# Patient Record
Sex: Male | Born: 1945
Health system: Southern US, Community
[De-identification: ages and names within clinical notes are randomized; demographics above are authoritative.]

## PROBLEM LIST (undated history)

## (undated) DIAGNOSIS — I10 Essential (primary) hypertension: Secondary | ICD-10-CM

## (undated) DIAGNOSIS — J45909 Unspecified asthma, uncomplicated: Secondary | ICD-10-CM

## (undated) DIAGNOSIS — I350 Nonrheumatic aortic (valve) stenosis: Secondary | ICD-10-CM

## (undated) DIAGNOSIS — K219 Gastro-esophageal reflux disease without esophagitis: Secondary | ICD-10-CM

## (undated) DIAGNOSIS — R079 Chest pain, unspecified: Secondary | ICD-10-CM

## (undated) DIAGNOSIS — M1990 Unspecified osteoarthritis, unspecified site: Secondary | ICD-10-CM

## (undated) DIAGNOSIS — E785 Hyperlipidemia, unspecified: Secondary | ICD-10-CM

## (undated) DIAGNOSIS — J449 Chronic obstructive pulmonary disease, unspecified: Secondary | ICD-10-CM

## (undated) HISTORY — DX: Essential (primary) hypertension: I10

## (undated) HISTORY — DX: Nonrheumatic aortic (valve) stenosis: I35.0

## (undated) HISTORY — DX: Chest pain, unspecified: R07.9

## (undated) HISTORY — DX: Chronic obstructive pulmonary disease, unspecified: J44.9

## (undated) HISTORY — DX: Hyperlipidemia, unspecified: E78.5

---

## 2002-08-08 ENCOUNTER — Emergency Department (HOSPITAL_COMMUNITY): Admission: EM | Admit: 2002-08-08 | Discharge: 2002-08-08 | Payer: Self-pay | Admitting: *Deleted

## 2005-05-20 ENCOUNTER — Ambulatory Visit (HOSPITAL_COMMUNITY): Admission: RE | Admit: 2005-05-20 | Discharge: 2005-05-20 | Payer: Self-pay | Admitting: Family Medicine

## 2007-11-11 ENCOUNTER — Encounter: Admission: RE | Admit: 2007-11-11 | Discharge: 2007-11-11 | Payer: Self-pay | Admitting: Neurosurgery

## 2008-03-03 HISTORY — PX: CATARACT EXTRACTION: SUR2

## 2012-07-13 ENCOUNTER — Encounter (HOSPITAL_COMMUNITY): Payer: Self-pay | Admitting: Pharmacy Technician

## 2012-07-13 NOTE — H&P (Signed)
NTS SOAP Note  Vital Signs:  Vitals as of: 07/13/2012: Systolic 153: Diastolic 80: Heart Rate 67: Temp 98.23F: Height 79ft 8in: Weight 195Lbs 0 Ounces: BMI 29.65  BMI : 29.65 kg/m2  Subjective: This 14 Years 45 Months old Male presents for screening TCS.  Never has had a colonosocpy.  No GI complaints.  No family h/o colon cancer.   Review of Symptoms:  Constitutional:unremarkable   Head:unremarkable    Eyes:unremarkable   Nose/Mouth/Throat:unremarkable Cardiovascular:  unremarkable   Respiratory:unremarkable   Gastrointestinal:  unremarkable   Genitourinary:unremarkable       joint, neck pain Skin:unremarkable Hematolgic/Lymphatic:unremarkable       hay fever   Past Medical History:    Reviewed   Past Medical History  Surgical History: unremarkable Medical Problems:  High Blood pressure Allergies: hydrocodone, amoxicillin Medications: crestor, omeprazole, fish oil   Social History:Reviewed  Social History  Preferred Language: English Race:  White Ethnicity: Not Hispanic / Latino Age: 67 Years 0 Months Marital Status:  M Alcohol: 2-4 beers a week Recreational drug(s):  No   Smoking Status: Unknown if ever smoked reviewed on 07/13/2012 Functional Status reviewed on mm/dd/yyyy ------------------------------------------------ Bathing: Normal Cooking: Normal Dressing: Normal Driving: Normal Eating: Normal Managing Meds: Normal Oral Care: Normal Shopping: Normal Toileting: Normal Transferring: Normal Walking: Normal Cognitive Status reviewed on mm/dd/yyyy ------------------------------------------------ Attention: Normal Decision Making: Normal Language: Normal Memory: Normal Motor: Normal Perception: Normal Problem Solving: Normal Visual and Spatial: Normal   Family History:  Reviewed   Family History              Father:  Cancer-lung, Coronary Artery Disease             Mother:  Coronary Artery  Disease    Objective Information: General:  Well appearing, well nourished in no distress. Heart:  RRR, no murmur Lungs:    CTA bilaterally, no wheezes, rhonchi, rales.  Breathing unlabored. Abdomen:Soft, NT/ND, no HSM, no masses.   deferred to procedure  Assessment:Need for screening TCS  Diagnosis &amp; Procedure Smart Code   Plan:Scheduled for screening TCS on 07/20/12.   Patient Education:Alternative treatments to surgery were discussed with patient (and family).  Risks and benefits  of procedure were fully explained to the patient (and family) who gave informed consent. Patient/family questions were addressed.  Follow-up:Pending Surgery

## 2012-07-20 ENCOUNTER — Encounter (HOSPITAL_COMMUNITY)
Admission: RE | Disposition: A | Discharge status: Home / Self Care | Payer: Self-pay | Source: Ambulatory Visit | Attending: General Surgery

## 2012-07-20 ENCOUNTER — Ambulatory Visit (HOSPITAL_COMMUNITY)
Admission: RE | Admit: 2012-07-20 | Discharge: 2012-07-20 | Disposition: A | Discharge status: Home / Self Care | Payer: Medicare Other | Source: Ambulatory Visit | Attending: General Surgery | Admitting: General Surgery

## 2012-07-20 ENCOUNTER — Encounter (HOSPITAL_COMMUNITY): Payer: Self-pay | Admitting: *Deleted

## 2012-07-20 DIAGNOSIS — K5730 Diverticulosis of large intestine without perforation or abscess without bleeding: Secondary | ICD-10-CM | POA: Insufficient documentation

## 2012-07-20 DIAGNOSIS — D129 Benign neoplasm of anus and anal canal: Secondary | ICD-10-CM | POA: Insufficient documentation

## 2012-07-20 DIAGNOSIS — Z1211 Encounter for screening for malignant neoplasm of colon: Secondary | ICD-10-CM | POA: Insufficient documentation

## 2012-07-20 DIAGNOSIS — D128 Benign neoplasm of rectum: Secondary | ICD-10-CM | POA: Insufficient documentation

## 2012-07-20 DIAGNOSIS — I10 Essential (primary) hypertension: Secondary | ICD-10-CM | POA: Insufficient documentation

## 2012-07-20 HISTORY — DX: Essential (primary) hypertension: I10

## 2012-07-20 HISTORY — DX: Gastro-esophageal reflux disease without esophagitis: K21.9

## 2012-07-20 HISTORY — PX: COLONOSCOPY: SHX5424

## 2012-07-20 HISTORY — DX: Unspecified osteoarthritis, unspecified site: M19.90

## 2012-07-20 SURGERY — COLONOSCOPY
Anesthesia: Moderate Sedation

## 2012-07-20 MED ORDER — MIDAZOLAM HCL 5 MG/5ML IJ SOLN
INTRAMUSCULAR | Status: AC
  Filled 2012-07-20: qty 5

## 2012-07-20 MED ORDER — MEPERIDINE HCL 25 MG/ML IJ SOLN
INTRAMUSCULAR | Status: DC | PRN
  Administered 2012-07-20: 50 mg via INTRAVENOUS

## 2012-07-20 MED ORDER — SODIUM CHLORIDE 0.9 % IV SOLN
INTRAVENOUS | Status: DC
  Administered 2012-07-20: 08:00:00 via INTRAVENOUS

## 2012-07-20 MED ORDER — STERILE WATER FOR IRRIGATION IR SOLN
Status: DC | PRN
  Administered 2012-07-20: 09:00:00

## 2012-07-20 MED ORDER — MIDAZOLAM HCL 5 MG/5ML IJ SOLN
INTRAMUSCULAR | Status: DC | PRN
  Administered 2012-07-20: 4 mg via INTRAVENOUS

## 2012-07-20 MED ORDER — MEPERIDINE HCL 50 MG/ML IJ SOLN
INTRAMUSCULAR | Status: AC
  Filled 2012-07-20: qty 1

## 2012-07-20 NOTE — Interval H&P Note (Signed)
History and Physical Interval Note:  07/20/2012 8:31 AM  Colin Weber  has presented today for surgery, with the diagnosis of screening  The various methods of treatment have been discussed with the patient and family. After consideration of risks, benefits and other options for treatment, the patient has consented to  Procedure(s): COLONOSCOPY (N/A) as a surgical intervention .  The patient's history has been reviewed, patient examined, no change in status, stable for surgery.  I have reviewed the patient's chart and labs.  Questions were answered to the patient's satisfaction.     Franky Macho A

## 2012-07-20 NOTE — Op Note (Signed)
[facility] [address] [place], [address]   COLONOSCOPY PROCEDURE REPORT  PATIENT: Colin Weber, Colin Weber  MR#: [id number] BIRTHDATE: 1945-09-29 , 67  yrs. old GENDER: Male ENDOSCOPIST: Franky Macho, MD REFERRED XB:JYNWG, Tammy PROCEDURE DATE:  07/20/2012 PROCEDURE:   Colonoscopy with snare polypectomy ASA CLASS:   Class II INDICATIONS:Average risk patient for colon cancer. MEDICATIONS: Demerol 50 mg IV and Versed 4 mg IV  DESCRIPTION OF PROCEDURE:   After the risks benefits and alternatives of the procedure were thoroughly explained, informed consent was obtained.  A digital rectal exam revealed no abnormalities of the rectum.   The [id number] ([id number])  endoscope was introduced through the anus and advanced to the cecum, which was identified by both the appendix and ileocecal valve. No adverse events experienced.   The quality of the prep was adequate, using MoviPrep  The instrument was then slowly withdrawn as the colon was fully examined.      COLON FINDINGS: Moderate diverticulosis was noted in the transverse colon and descending colon.   A smooth sessile polyp ranging between 3-60mm in size was found in the rectum.  A polypectomy was performed using snare cautery.  The resection was complete and the polyp tissue was completely retrieved.  Retroflexed views revealed no abnormalities. The time to cecum=4 minutes 0 seconds. Withdrawal time=6 minutes 0 seconds.  The scope was withdrawn and the procedure completed. COMPLICATIONS: There were no complications.  ENDOSCOPIC IMPRESSION: 1.   Moderate diverticulosis was noted in the transverse colon and descending colon 2.   Sessile polyp ranging between 3-9mm in size was found in the rectum; polypectomy was performed using snare cautery  RECOMMENDATIONS: 1.  Await pathology results 2.  Repeat Colonoscopy in 3 years.   eSigned:  Franky Macho, MD 07/20/2012 8:52 AM   cc:

## 2012-07-22 ENCOUNTER — Encounter (HOSPITAL_COMMUNITY): Payer: Self-pay | Admitting: General Surgery

## 2015-05-21 DIAGNOSIS — I10 Essential (primary) hypertension: Secondary | ICD-10-CM | POA: Diagnosis not present

## 2015-05-21 DIAGNOSIS — Z125 Encounter for screening for malignant neoplasm of prostate: Secondary | ICD-10-CM | POA: Diagnosis not present

## 2015-05-21 DIAGNOSIS — E782 Mixed hyperlipidemia: Secondary | ICD-10-CM | POA: Diagnosis not present

## 2015-05-23 DIAGNOSIS — I10 Essential (primary) hypertension: Secondary | ICD-10-CM | POA: Diagnosis not present

## 2015-05-23 DIAGNOSIS — J00 Acute nasopharyngitis [common cold]: Secondary | ICD-10-CM | POA: Diagnosis not present

## 2015-05-23 DIAGNOSIS — E782 Mixed hyperlipidemia: Secondary | ICD-10-CM | POA: Diagnosis not present

## 2015-05-23 DIAGNOSIS — J449 Chronic obstructive pulmonary disease, unspecified: Secondary | ICD-10-CM | POA: Diagnosis not present

## 2015-11-28 DIAGNOSIS — E782 Mixed hyperlipidemia: Secondary | ICD-10-CM | POA: Diagnosis not present

## 2015-11-30 DIAGNOSIS — J449 Chronic obstructive pulmonary disease, unspecified: Secondary | ICD-10-CM | POA: Diagnosis not present

## 2015-11-30 DIAGNOSIS — E785 Hyperlipidemia, unspecified: Secondary | ICD-10-CM | POA: Diagnosis not present

## 2015-11-30 DIAGNOSIS — I10 Essential (primary) hypertension: Secondary | ICD-10-CM | POA: Diagnosis not present

## 2015-11-30 DIAGNOSIS — Z0000 Encounter for general adult medical examination without abnormal findings: Secondary | ICD-10-CM | POA: Diagnosis not present

## 2015-12-27 DIAGNOSIS — Z86010 Personal history of colonic polyps: Secondary | ICD-10-CM | POA: Diagnosis not present

## 2015-12-27 NOTE — H&P (Signed)
  NTS SOAP Note  Vital Signs:  Vitals as of: [id number]: Systolic [id number]: Diastolic 81: Heart Rate 72: Temp 57F (Temporal): Height 20ft 6in: Weight 186Lbs 0 Ounces: BMI 30.02   BMI : 30.02 kg/m2  Subjective: This 70 year old male presents for of personal h/o colon polyps.  Had a polyp removed in 5/14.  Here for follow up scheduling of colonoscopy.  Referred by Dr. Nevada Crane.  Denies any gi complaints, blood in stool, weight changes, abdominal pain, diarrhea, constipation, family h/o colon cancer.  Review of Symptoms:  Constitutional:negative Head:negative Eyes:negative Nose/Mouth/Throat:negative Cardiovascular:negative Respiratory:dyspnea, wheezing Gastrointestinnegative Genitourinary:negative Musculoskeletal:negative Skin:negative Hematolgic/Lymphatic:negative Allergic/Immunologic:negative   Past Medical History:Reviewed  Past Medical History  Surgical History: TCS 5/14 Medical Problems: HTN, copd Allergies: nkda Medications: atenolol, fish oil, combivent   Social History:Reviewed  Social History  Preferred Language: English Race:  White Ethnicity: Not Hispanic / Latino Age: 23 year Marital Status:  S Alcohol: unknown   Smoking Status: Current every day smoker reviewed on 12/27/2015 Started Date:  Packs per week:  Functional Status reviewed on 12/27/2015 ------------------------------------------------ Bathing: Normal Cooking: Normal Dressing: Normal Driving: Normal Eating: Normal Managing Meds: Normal Oral Care: Normal Shopping: Normal Toileting: Normal Transferring: Normal Walking: Normal Cognitive Status reviewed on 12/27/2015 ------------------------------------------------ Attention: Normal Decision Making: Normal Language: Normal Memory: Normal Motor: Normal Perception: Normal Problem Solving: Normal Visual and Spatial: Normal   Family History:Reviewed  Family Health History Mother, Deceased; Heart disease;  Father,  Deceased; Lung cancer;     Objective Information: General:Well appearing, well nourished in no distress. Head:Atraumatic; no masses; no abnormalities Neck:Supple without lymphadenopathy.  Heart:RRR, no murmur or gallop.  Normal S1, S2.  No S3, S4.  Lungs:CTA bilaterally, no wheezes, rhonchi, rales.  Breathing unlabored. Abdomen:Soft, NT/ND, normal bowel sounds, no HSM, no masses.  No peritoneal signs. deferred to procedure  Assessment:Personal h/o colon polyp  Diagnoses: V12.72  Z86.010 History of polyp of colon (Personal history of colonic polyps)  Procedures: [id number] - OFFICE OUTPATIENT NEW 20 MINUTES    Plan:  Scheduled for colonoscopy on 01/01/16.  Trilyte prescribed.   Patient Education:Alternative treatments to surgery were discussed with patient (and family).Risks and benefits  of procedure including bleeding and perforation were fully explained to the patient (and family) who gave informed consent. Patient/family questions were addressed.  Follow-up:Pending Surgery

## 2016-01-01 ENCOUNTER — Encounter (HOSPITAL_COMMUNITY)
Admission: RE | Disposition: A | Discharge status: Home / Self Care | Payer: Self-pay | Source: Ambulatory Visit | Attending: General Surgery

## 2016-01-01 ENCOUNTER — Ambulatory Visit (HOSPITAL_COMMUNITY)
Admission: RE | Admit: 2016-01-01 | Discharge: 2016-01-01 | Disposition: A | Discharge status: Home / Self Care | Payer: PPO | Source: Ambulatory Visit | Attending: General Surgery | Admitting: General Surgery

## 2016-01-01 ENCOUNTER — Encounter (HOSPITAL_COMMUNITY): Payer: Self-pay

## 2016-01-01 DIAGNOSIS — K5730 Diverticulosis of large intestine without perforation or abscess without bleeding: Secondary | ICD-10-CM | POA: Insufficient documentation

## 2016-01-01 DIAGNOSIS — Z86010 Personal history of colonic polyps: Secondary | ICD-10-CM | POA: Diagnosis not present

## 2016-01-01 DIAGNOSIS — J449 Chronic obstructive pulmonary disease, unspecified: Secondary | ICD-10-CM | POA: Diagnosis not present

## 2016-01-01 DIAGNOSIS — I10 Essential (primary) hypertension: Secondary | ICD-10-CM | POA: Diagnosis not present

## 2016-01-01 DIAGNOSIS — Z1211 Encounter for screening for malignant neoplasm of colon: Secondary | ICD-10-CM | POA: Insufficient documentation

## 2016-01-01 HISTORY — PX: COLONOSCOPY: SHX5424

## 2016-01-01 SURGERY — COLONOSCOPY
Anesthesia: Moderate Sedation

## 2016-01-01 MED ORDER — SODIUM CHLORIDE 0.9 % IV SOLN
INTRAVENOUS | Status: DC
  Administered 2016-01-01: 07:00:00 via INTRAVENOUS

## 2016-01-01 MED ORDER — MIDAZOLAM HCL 5 MG/5ML IJ SOLN
INTRAMUSCULAR | Status: DC | PRN
  Administered 2016-01-01: 2 mg via INTRAVENOUS
  Administered 2016-01-01: 1 mg via INTRAVENOUS

## 2016-01-01 MED ORDER — MIDAZOLAM HCL 5 MG/5ML IJ SOLN
INTRAMUSCULAR | Status: AC
  Filled 2016-01-01: qty 10

## 2016-01-01 MED ORDER — MEPERIDINE HCL 50 MG/ML IJ SOLN
INTRAMUSCULAR | Status: AC
  Filled 2016-01-01: qty 1

## 2016-01-01 MED ORDER — MEPERIDINE HCL 50 MG/ML IJ SOLN
INTRAMUSCULAR | Status: DC | PRN
  Administered 2016-01-01: 50 mg via INTRAVENOUS

## 2016-01-01 NOTE — Interval H&P Note (Signed)
History and Physical Interval Note:  01/01/2016 7:27 AM  Colin Weber  has presented today for surgery, with the diagnosis of history of colon polyps  The various methods of treatment have been discussed with the patient and family. After consideration of risks, benefits and other options for treatment, the patient has consented to  Procedure(s): COLONOSCOPY (N/A) as a surgical intervention .  The patient's history has been reviewed, patient examined, no change in status, stable for surgery.  I have reviewed the patient's chart and labs.  Questions were answered to the patient's satisfaction.     [name] A

## 2016-01-01 NOTE — Op Note (Signed)
[facility] Patient Name: Colin Weber Procedure Date: 01/01/2016 7:28 AM MRN: [id number] Date of Birth: [birth year] Attending MD: [name] , MD CSN: [id number] Age: 70 Admit Type: Outpatient Procedure:                Colonoscopy Indications:              High risk colon cancer surveillance: Personal                            history of adenoma less than 10 mm in size Providers:                [name], MD, Charlyne Petrin RN, RN, Sherlyn Lees, Technician Referring MD:              Medicines:                Midazolam 3 mg IV, Meperidine 50 mg IV Complications:            No immediate complications. Estimated Blood Loss:     Estimated blood loss: none. Procedure:                Pre-Anesthesia Assessment:                           - Prior to the procedure, a History and Physical                            was performed, and patient medications and                            allergies were reviewed. The patient is competent.                            The risks and benefits of the procedure and the                            sedation options and risks were discussed with the                            patient. All questions were answered and informed                            consent was obtained. Patient identification and                            proposed procedure were verified by the nurse in                            the procedure room. Mental Status Examination:                            alert and oriented. Airway Examination: normal  oropharyngeal airway and neck mobility. Respiratory                            Examination: clear to auscultation. CV Examination:                            RRR, no murmurs, no S3 or S4. Prophylactic                            Antibiotics: The patient does not require                            prophylactic antibiotics. Prior Anticoagulants: The   patient has taken no previous anticoagulant or                            antiplatelet agents. ASA Grade Assessment: II - A                            patient with mild systemic disease. After reviewing                            the risks and benefits, the patient was deemed in                            satisfactory condition to undergo the procedure.                            The anesthesia plan was to use moderate sedation /                            analgesia (conscious sedation). Immediately prior                            to administration of medications, the patient was                            re-assessed for adequacy to receive sedatives. The                            heart rate, respiratory rate, oxygen saturations,                            blood pressure, adequacy of pulmonary ventilation,                            and response to care were monitored throughout the                            procedure. The physical status of the patient was                            re-assessed after the procedure.  After obtaining informed consent, the colonoscope                            was passed under direct vision. Throughout the                            procedure, the patient's blood pressure, pulse, and                            oxygen saturations were monitored continuously. The                            [id number]) scope was introduced through                            the anus and advanced to the the ileocecal valve.                            No anatomical landmarks were photographed. The                            entire colon was well visualized. The colonoscopy                            was performed without difficulty. The patient                            tolerated the procedure well. The quality of the                            bowel preparation was adequate. The total duration                            of the procedure was 1  hour 14 minutes. Scope In: 7:32:31 AM Scope Out: [id number] AM Scope Withdrawal Time: 0 hours 7 minutes 59 seconds  Total Procedure Duration: 0 hours 11 minutes 47 seconds  Findings:      The perianal and digital rectal examinations were normal.      Multiple small-mouthed diverticula were found in the descending colon.       Estimated blood loss: none.      The exam was otherwise without abnormality on direct and retroflexion       views. Impression:               - Mild diverticulosis in the descending colon.                           - The examination was otherwise normal on direct                            and retroflexion views.                           - No specimens collected. Moderate Sedation:      Moderate (conscious) sedation was administered by the endoscopy  nurse       and supervised by the endoscopist. The following parameters were       monitored: oxygen saturation, heart rate, blood pressure, and response       to care. Total physician intraservice time was 14 minutes. Recommendation:           - Written discharge instructions were provided to                            the patient.                           - The signs and symptoms of potential delayed                            complications were discussed with the patient.                           - Patient has a contact number available for                            emergencies.                           - Return to normal activities tomorrow.                           - Resume previous diet.                           - Continue present medications.                           - Repeat colonoscopy in 10 years for screening                            purposes. Procedure Code(s):        --- Professional ---                           [phone], Colonoscopy, flexible; diagnostic, including                            collection of specimen(s) by brushing or washing,                            when performed (separate  procedure)                           [id number], Moderate sedation services provided by the                            same physician or other qualified health care                            professional performing the diagnostic or  therapeutic service that the sedation supports,                            requiring the presence of an independent trained                            observer to assist in the monitoring of the                            patient's level of consciousness and physiological                            status; initial 15 minutes of intraservice time,                            patient age 57 years or older Diagnosis Code(s):        --- Professional ---                           Z86.010, Personal history of colonic polyps                           K57.30, Diverticulosis of large intestine without                            perforation or abscess without bleeding CPT copyright 2016 [employer]. All rights reserved. The codes documented in this report are preliminary and upon coder review may  be revised to meet current compliance requirements. [name], MD [name], MD 01/01/2016 7:50:49 AM This report has been signed electronically. Number of Addenda: 0

## 2016-01-01 NOTE — Discharge Instructions (Signed)
Diverticulosis °Diverticulosis is the condition that develops when small pouches (diverticula) form in the wall of your colon. Your colon, or large intestine, is where water is absorbed and stool is formed. The pouches form when the inside layer of your colon pushes through weak spots in the outer layers of your colon. °CAUSES  °No one knows exactly what causes diverticulosis. °RISK FACTORS °· Being older than 50. Your risk for this condition increases with age. Diverticulosis is rare in people younger than 40 years. By age 80, almost everyone has it. °· Eating a low-fiber diet. °· Being frequently constipated. °· Being overweight. °· Not getting enough exercise. °· Smoking. °· Taking over-the-counter pain medicines, like aspirin and ibuprofen. °SYMPTOMS  °Most people with diverticulosis do not have symptoms. °DIAGNOSIS  °Because diverticulosis often has no symptoms, health care providers often discover the condition during an exam for other colon problems. In many cases, a health care provider will diagnose diverticulosis while using a flexible scope to examine the colon (colonoscopy). °TREATMENT  °If you have never developed an infection related to diverticulosis, you may not need treatment. If you have had an infection before, treatment may include: °· Eating more fruits, vegetables, and grains. °· Taking a fiber supplement. °· Taking a live bacteria supplement (probiotic). °· Taking medicine to relax your colon. °HOME CARE INSTRUCTIONS  °· Drink at least 6-8 glasses of water each day to prevent constipation. °· Try not to strain when you have a bowel movement. °· Keep all follow-up appointments. °If you have had an infection before:  °· Increase the fiber in your diet as directed by your health care provider or dietitian. °· Take a dietary fiber supplement if your health care provider approves. °· Only take medicines as directed by your health care provider. °SEEK MEDICAL CARE IF:  °· You have abdominal  pain. °· You have bloating. °· You have cramps. °· You have not gone to the bathroom in 3 days. °SEEK IMMEDIATE MEDICAL CARE IF:  °· Your pain gets worse. °· Your bloating becomes very bad. °· You have a fever or chills, and your symptoms suddenly get worse. °· You begin vomiting. °· You have bowel movements that are bloody or black. °MAKE SURE YOU: °· Understand these instructions. °· Will watch your condition. °· Will get help right away if you are not doing well or get worse. °  °This information is not intended to replace advice given to you by your health care provider. Make sure you discuss any questions you have with your health care provider. °  °Document Released: 11/15/2003 Document Revised: 02/22/2013 Document Reviewed: 01/12/2013 °Elsevier Interactive Patient Education ©2016 [employer] °Colonoscopy, Care After °Refer to this sheet in the next few weeks. These instructions provide you with information on caring for yourself after your procedure. Your health care provider may also give you more specific instructions. Your treatment has been planned according to current medical practices, but problems sometimes occur. Call your health care provider if you have any problems or questions after your procedure. °WHAT TO EXPECT AFTER THE PROCEDURE  °After your procedure, it is typical to have the following: °· A small amount of blood in your stool. °· Moderate amounts of gas and mild abdominal cramping or bloating. °HOME CARE INSTRUCTIONS °· Do not drive, operate machinery, or sign important documents for 24 hours. °· You may shower and resume your regular physical activities, but move at a slower pace for the first 24 hours. °· Take frequent rest periods for the   first 24 hours. °· Walk around or put a warm pack on your abdomen to help reduce abdominal cramping and bloating. °· Drink enough fluids to keep your urine clear or pale yellow. °· You may resume your normal diet as instructed by your health care  provider. Avoid heavy or fried foods that are hard to digest. °· Avoid drinking alcohol for 24 hours or as instructed by your health care provider. °· Only take over-the-counter or prescription medicines as directed by your health care provider. °· If a tissue sample (biopsy) was taken during your procedure: °· Do not take aspirin or blood thinners for 7 days, or as instructed by your health care provider. °· Do not drink alcohol for 7 days, or as instructed by your health care provider. °· Eat soft foods for the first 24 hours. °SEEK MEDICAL CARE IF: °You have persistent spotting of blood in your stool 2-3 days after the procedure. °SEEK IMMEDIATE MEDICAL CARE IF: °· You have more than a small spotting of blood in your stool. °· You pass large blood clots in your stool. °· Your abdomen is swollen (distended). °· You have nausea or vomiting. °· You have a fever. °· You have increasing abdominal pain that is not relieved with medicine. °  °This information is not intended to replace advice given to you by your health care provider. Make sure you discuss any questions you have with your health care provider. °  °Document Released: 10/02/2003 Document Revised: 12/08/2012 Document Reviewed: 10/25/2012 °Elsevier Interactive Patient Education ©2016 [employer] ° °

## 2016-01-02 ENCOUNTER — Encounter (HOSPITAL_COMMUNITY): Payer: Self-pay | Admitting: General Surgery

## 2016-08-12 DIAGNOSIS — Z961 Presence of intraocular lens: Secondary | ICD-10-CM | POA: Diagnosis not present

## 2016-08-12 DIAGNOSIS — H53022 Refractive amblyopia, left eye: Secondary | ICD-10-CM | POA: Diagnosis not present

## 2016-12-01 DIAGNOSIS — I10 Essential (primary) hypertension: Secondary | ICD-10-CM | POA: Diagnosis not present

## 2016-12-01 DIAGNOSIS — E785 Hyperlipidemia, unspecified: Secondary | ICD-10-CM | POA: Diagnosis not present

## 2016-12-03 DIAGNOSIS — I10 Essential (primary) hypertension: Secondary | ICD-10-CM | POA: Diagnosis not present

## 2016-12-03 DIAGNOSIS — J449 Chronic obstructive pulmonary disease, unspecified: Secondary | ICD-10-CM | POA: Diagnosis not present

## 2016-12-03 DIAGNOSIS — Z0000 Encounter for general adult medical examination without abnormal findings: Secondary | ICD-10-CM | POA: Diagnosis not present

## 2016-12-03 DIAGNOSIS — E785 Hyperlipidemia, unspecified: Secondary | ICD-10-CM | POA: Diagnosis not present

## 2016-12-29 DIAGNOSIS — Z23 Encounter for immunization: Secondary | ICD-10-CM | POA: Diagnosis not present

## 2017-09-01 DIAGNOSIS — H52203 Unspecified astigmatism, bilateral: Secondary | ICD-10-CM | POA: Diagnosis not present

## 2017-09-01 DIAGNOSIS — H53022 Refractive amblyopia, left eye: Secondary | ICD-10-CM | POA: Diagnosis not present

## 2017-09-01 DIAGNOSIS — Z961 Presence of intraocular lens: Secondary | ICD-10-CM | POA: Diagnosis not present

## 2017-09-01 DIAGNOSIS — H5211 Myopia, right eye: Secondary | ICD-10-CM | POA: Diagnosis not present

## 2017-09-01 DIAGNOSIS — H524 Presbyopia: Secondary | ICD-10-CM | POA: Diagnosis not present

## 2017-12-09 DIAGNOSIS — I10 Essential (primary) hypertension: Secondary | ICD-10-CM | POA: Diagnosis not present

## 2017-12-09 DIAGNOSIS — E785 Hyperlipidemia, unspecified: Secondary | ICD-10-CM | POA: Diagnosis not present

## 2017-12-09 DIAGNOSIS — Z125 Encounter for screening for malignant neoplasm of prostate: Secondary | ICD-10-CM | POA: Diagnosis not present

## 2017-12-09 DIAGNOSIS — Z0000 Encounter for general adult medical examination without abnormal findings: Secondary | ICD-10-CM | POA: Diagnosis not present

## 2017-12-11 DIAGNOSIS — Z0000 Encounter for general adult medical examination without abnormal findings: Secondary | ICD-10-CM | POA: Diagnosis not present

## 2017-12-11 DIAGNOSIS — I10 Essential (primary) hypertension: Secondary | ICD-10-CM | POA: Diagnosis not present

## 2017-12-11 DIAGNOSIS — Z6829 Body mass index (BMI) 29.0-29.9, adult: Secondary | ICD-10-CM | POA: Diagnosis not present

## 2017-12-11 DIAGNOSIS — E782 Mixed hyperlipidemia: Secondary | ICD-10-CM | POA: Diagnosis not present

## 2017-12-11 DIAGNOSIS — J449 Chronic obstructive pulmonary disease, unspecified: Secondary | ICD-10-CM | POA: Diagnosis not present

## 2017-12-11 DIAGNOSIS — R0989 Other specified symptoms and signs involving the circulatory and respiratory systems: Secondary | ICD-10-CM | POA: Diagnosis not present

## 2018-01-22 DIAGNOSIS — Z23 Encounter for immunization: Secondary | ICD-10-CM | POA: Diagnosis not present

## 2018-06-24 DIAGNOSIS — B370 Candidal stomatitis: Secondary | ICD-10-CM | POA: Diagnosis not present

## 2018-07-07 DIAGNOSIS — B379 Candidiasis, unspecified: Secondary | ICD-10-CM | POA: Diagnosis not present

## 2018-07-07 DIAGNOSIS — R109 Unspecified abdominal pain: Secondary | ICD-10-CM | POA: Diagnosis not present

## 2018-07-12 DIAGNOSIS — B379 Candidiasis, unspecified: Secondary | ICD-10-CM | POA: Diagnosis not present

## 2018-07-12 DIAGNOSIS — R109 Unspecified abdominal pain: Secondary | ICD-10-CM | POA: Diagnosis not present

## 2018-07-13 DIAGNOSIS — Z0000 Encounter for general adult medical examination without abnormal findings: Secondary | ICD-10-CM | POA: Diagnosis not present

## 2018-07-30 ENCOUNTER — Other Ambulatory Visit: Payer: Self-pay | Admitting: Internal Medicine

## 2018-07-30 DIAGNOSIS — B370 Candidal stomatitis: Secondary | ICD-10-CM | POA: Diagnosis not present

## 2018-07-30 DIAGNOSIS — Z6829 Body mass index (BMI) 29.0-29.9, adult: Secondary | ICD-10-CM | POA: Diagnosis not present

## 2018-07-30 DIAGNOSIS — Z23 Encounter for immunization: Secondary | ICD-10-CM | POA: Diagnosis not present

## 2018-07-30 DIAGNOSIS — R109 Unspecified abdominal pain: Secondary | ICD-10-CM | POA: Diagnosis not present

## 2018-07-30 DIAGNOSIS — B379 Candidiasis, unspecified: Secondary | ICD-10-CM | POA: Diagnosis not present

## 2018-07-30 DIAGNOSIS — R0989 Other specified symptoms and signs involving the circulatory and respiratory systems: Secondary | ICD-10-CM | POA: Diagnosis not present

## 2018-07-30 DIAGNOSIS — E782 Mixed hyperlipidemia: Secondary | ICD-10-CM | POA: Diagnosis not present

## 2018-07-30 DIAGNOSIS — J449 Chronic obstructive pulmonary disease, unspecified: Secondary | ICD-10-CM | POA: Diagnosis not present

## 2018-07-30 DIAGNOSIS — I10 Essential (primary) hypertension: Secondary | ICD-10-CM | POA: Diagnosis not present

## 2018-07-30 DIAGNOSIS — Z0000 Encounter for general adult medical examination without abnormal findings: Secondary | ICD-10-CM | POA: Diagnosis not present

## 2018-07-30 DIAGNOSIS — R1011 Right upper quadrant pain: Secondary | ICD-10-CM

## 2018-07-30 DIAGNOSIS — L509 Urticaria, unspecified: Secondary | ICD-10-CM | POA: Diagnosis not present

## 2018-07-30 DIAGNOSIS — R1010 Upper abdominal pain, unspecified: Secondary | ICD-10-CM | POA: Diagnosis not present

## 2018-09-06 DIAGNOSIS — H52202 Unspecified astigmatism, left eye: Secondary | ICD-10-CM | POA: Diagnosis not present

## 2018-09-06 DIAGNOSIS — Z961 Presence of intraocular lens: Secondary | ICD-10-CM | POA: Diagnosis not present

## 2018-09-06 DIAGNOSIS — H524 Presbyopia: Secondary | ICD-10-CM | POA: Diagnosis not present

## 2018-09-16 ENCOUNTER — Other Ambulatory Visit: Payer: Self-pay

## 2018-09-16 ENCOUNTER — Ambulatory Visit (HOSPITAL_COMMUNITY)
Admission: RE | Admit: 2018-09-16 | Discharge: 2018-09-16 | Disposition: A | Discharge status: Home / Self Care | Payer: PPO | Source: Ambulatory Visit | Attending: Internal Medicine | Admitting: Internal Medicine

## 2018-09-16 DIAGNOSIS — R1011 Right upper quadrant pain: Secondary | ICD-10-CM | POA: Diagnosis not present

## 2018-09-30 ENCOUNTER — Other Ambulatory Visit: Payer: Self-pay

## 2018-12-20 DIAGNOSIS — E782 Mixed hyperlipidemia: Secondary | ICD-10-CM | POA: Diagnosis not present

## 2018-12-20 DIAGNOSIS — I10 Essential (primary) hypertension: Secondary | ICD-10-CM | POA: Diagnosis not present

## 2018-12-24 DIAGNOSIS — J449 Chronic obstructive pulmonary disease, unspecified: Secondary | ICD-10-CM | POA: Diagnosis not present

## 2018-12-24 DIAGNOSIS — Z0000 Encounter for general adult medical examination without abnormal findings: Secondary | ICD-10-CM | POA: Diagnosis not present

## 2018-12-24 DIAGNOSIS — E782 Mixed hyperlipidemia: Secondary | ICD-10-CM | POA: Diagnosis not present

## 2018-12-24 DIAGNOSIS — L723 Sebaceous cyst: Secondary | ICD-10-CM | POA: Diagnosis not present

## 2018-12-24 DIAGNOSIS — I10 Essential (primary) hypertension: Secondary | ICD-10-CM | POA: Diagnosis not present

## 2018-12-24 DIAGNOSIS — N410 Acute prostatitis: Secondary | ICD-10-CM | POA: Diagnosis not present

## 2019-01-25 ENCOUNTER — Other Ambulatory Visit: Payer: Self-pay

## 2019-02-07 DIAGNOSIS — I10 Essential (primary) hypertension: Secondary | ICD-10-CM | POA: Diagnosis not present

## 2019-02-07 DIAGNOSIS — E782 Mixed hyperlipidemia: Secondary | ICD-10-CM | POA: Diagnosis not present

## 2019-02-07 DIAGNOSIS — J449 Chronic obstructive pulmonary disease, unspecified: Secondary | ICD-10-CM | POA: Diagnosis not present

## 2019-04-01 DIAGNOSIS — J449 Chronic obstructive pulmonary disease, unspecified: Secondary | ICD-10-CM | POA: Diagnosis not present

## 2019-04-01 DIAGNOSIS — E782 Mixed hyperlipidemia: Secondary | ICD-10-CM | POA: Diagnosis not present

## 2019-04-01 DIAGNOSIS — I10 Essential (primary) hypertension: Secondary | ICD-10-CM | POA: Diagnosis not present

## 2019-04-24 DIAGNOSIS — E7849 Other hyperlipidemia: Secondary | ICD-10-CM | POA: Diagnosis not present

## 2019-04-24 DIAGNOSIS — J449 Chronic obstructive pulmonary disease, unspecified: Secondary | ICD-10-CM | POA: Diagnosis not present

## 2019-04-24 DIAGNOSIS — I10 Essential (primary) hypertension: Secondary | ICD-10-CM | POA: Diagnosis not present

## 2019-09-06 DIAGNOSIS — H524 Presbyopia: Secondary | ICD-10-CM | POA: Diagnosis not present

## 2019-09-06 DIAGNOSIS — H52202 Unspecified astigmatism, left eye: Secondary | ICD-10-CM | POA: Diagnosis not present

## 2019-09-06 DIAGNOSIS — Z961 Presence of intraocular lens: Secondary | ICD-10-CM | POA: Diagnosis not present

## 2019-10-11 DIAGNOSIS — E782 Mixed hyperlipidemia: Secondary | ICD-10-CM | POA: Diagnosis not present

## 2019-10-11 DIAGNOSIS — E7849 Other hyperlipidemia: Secondary | ICD-10-CM | POA: Diagnosis not present

## 2019-10-11 DIAGNOSIS — Z23 Encounter for immunization: Secondary | ICD-10-CM | POA: Diagnosis not present

## 2019-10-11 DIAGNOSIS — B370 Candidal stomatitis: Secondary | ICD-10-CM | POA: Diagnosis not present

## 2019-10-11 DIAGNOSIS — J449 Chronic obstructive pulmonary disease, unspecified: Secondary | ICD-10-CM | POA: Diagnosis not present

## 2019-10-11 DIAGNOSIS — R0602 Shortness of breath: Secondary | ICD-10-CM | POA: Diagnosis not present

## 2019-10-11 DIAGNOSIS — R109 Unspecified abdominal pain: Secondary | ICD-10-CM | POA: Diagnosis not present

## 2019-10-11 DIAGNOSIS — Z0000 Encounter for general adult medical examination without abnormal findings: Secondary | ICD-10-CM | POA: Diagnosis not present

## 2019-10-11 DIAGNOSIS — R011 Cardiac murmur, unspecified: Secondary | ICD-10-CM | POA: Diagnosis not present

## 2019-10-11 DIAGNOSIS — L509 Urticaria, unspecified: Secondary | ICD-10-CM | POA: Diagnosis not present

## 2019-10-11 DIAGNOSIS — B379 Candidiasis, unspecified: Secondary | ICD-10-CM | POA: Diagnosis not present

## 2019-10-11 DIAGNOSIS — I10 Essential (primary) hypertension: Secondary | ICD-10-CM | POA: Diagnosis not present

## 2019-10-11 DIAGNOSIS — Z6829 Body mass index (BMI) 29.0-29.9, adult: Secondary | ICD-10-CM | POA: Diagnosis not present

## 2019-10-11 DIAGNOSIS — R079 Chest pain, unspecified: Secondary | ICD-10-CM | POA: Diagnosis not present

## 2019-10-11 DIAGNOSIS — R0989 Other specified symptoms and signs involving the circulatory and respiratory systems: Secondary | ICD-10-CM | POA: Diagnosis not present

## 2019-10-12 ENCOUNTER — Other Ambulatory Visit (HOSPITAL_COMMUNITY): Payer: Self-pay | Admitting: Adult Health Nurse Practitioner

## 2019-10-12 ENCOUNTER — Ambulatory Visit (HOSPITAL_COMMUNITY)
Admission: RE | Admit: 2019-10-12 | Discharge: 2019-10-12 | Disposition: A | Discharge status: Home / Self Care | Payer: PPO | Source: Ambulatory Visit | Attending: Adult Health Nurse Practitioner | Admitting: Adult Health Nurse Practitioner

## 2019-10-12 ENCOUNTER — Other Ambulatory Visit: Payer: Self-pay

## 2019-10-12 DIAGNOSIS — R079 Chest pain, unspecified: Secondary | ICD-10-CM | POA: Insufficient documentation

## 2019-10-25 DIAGNOSIS — I10 Essential (primary) hypertension: Secondary | ICD-10-CM | POA: Diagnosis not present

## 2019-10-25 DIAGNOSIS — E782 Mixed hyperlipidemia: Secondary | ICD-10-CM | POA: Diagnosis not present

## 2019-10-25 DIAGNOSIS — L509 Urticaria, unspecified: Secondary | ICD-10-CM | POA: Diagnosis not present

## 2019-10-25 DIAGNOSIS — J449 Chronic obstructive pulmonary disease, unspecified: Secondary | ICD-10-CM | POA: Diagnosis not present

## 2019-10-25 DIAGNOSIS — D519 Vitamin B12 deficiency anemia, unspecified: Secondary | ICD-10-CM | POA: Diagnosis not present

## 2019-10-25 DIAGNOSIS — R079 Chest pain, unspecified: Secondary | ICD-10-CM | POA: Diagnosis not present

## 2019-10-25 DIAGNOSIS — E7849 Other hyperlipidemia: Secondary | ICD-10-CM | POA: Diagnosis not present

## 2019-10-25 DIAGNOSIS — R011 Cardiac murmur, unspecified: Secondary | ICD-10-CM | POA: Diagnosis not present

## 2019-10-25 DIAGNOSIS — Z0001 Encounter for general adult medical examination with abnormal findings: Secondary | ICD-10-CM | POA: Diagnosis not present

## 2019-10-25 DIAGNOSIS — Z0000 Encounter for general adult medical examination without abnormal findings: Secondary | ICD-10-CM | POA: Diagnosis not present

## 2019-10-25 DIAGNOSIS — B379 Candidiasis, unspecified: Secondary | ICD-10-CM | POA: Diagnosis not present

## 2019-10-25 DIAGNOSIS — R0989 Other specified symptoms and signs involving the circulatory and respiratory systems: Secondary | ICD-10-CM | POA: Diagnosis not present

## 2019-10-25 DIAGNOSIS — R0602 Shortness of breath: Secondary | ICD-10-CM | POA: Diagnosis not present

## 2019-10-25 DIAGNOSIS — B370 Candidal stomatitis: Secondary | ICD-10-CM | POA: Diagnosis not present

## 2019-10-25 DIAGNOSIS — Z6829 Body mass index (BMI) 29.0-29.9, adult: Secondary | ICD-10-CM | POA: Diagnosis not present

## 2019-10-25 DIAGNOSIS — Z23 Encounter for immunization: Secondary | ICD-10-CM | POA: Diagnosis not present

## 2019-11-01 DIAGNOSIS — D519 Vitamin B12 deficiency anemia, unspecified: Secondary | ICD-10-CM | POA: Diagnosis not present

## 2019-11-08 DIAGNOSIS — D519 Vitamin B12 deficiency anemia, unspecified: Secondary | ICD-10-CM | POA: Diagnosis not present

## 2019-11-15 DIAGNOSIS — D519 Vitamin B12 deficiency anemia, unspecified: Secondary | ICD-10-CM | POA: Diagnosis not present

## 2019-12-13 DIAGNOSIS — D519 Vitamin B12 deficiency anemia, unspecified: Secondary | ICD-10-CM | POA: Diagnosis not present

## 2019-12-19 DIAGNOSIS — Z23 Encounter for immunization: Secondary | ICD-10-CM | POA: Diagnosis not present

## 2019-12-28 DIAGNOSIS — I10 Essential (primary) hypertension: Secondary | ICD-10-CM | POA: Diagnosis not present

## 2019-12-28 DIAGNOSIS — B370 Candidal stomatitis: Secondary | ICD-10-CM | POA: Diagnosis not present

## 2019-12-28 DIAGNOSIS — E782 Mixed hyperlipidemia: Secondary | ICD-10-CM | POA: Diagnosis not present

## 2019-12-28 DIAGNOSIS — R011 Cardiac murmur, unspecified: Secondary | ICD-10-CM | POA: Diagnosis not present

## 2019-12-28 DIAGNOSIS — Z0001 Encounter for general adult medical examination with abnormal findings: Secondary | ICD-10-CM | POA: Diagnosis not present

## 2019-12-28 DIAGNOSIS — B379 Candidiasis, unspecified: Secondary | ICD-10-CM | POA: Diagnosis not present

## 2019-12-28 DIAGNOSIS — L509 Urticaria, unspecified: Secondary | ICD-10-CM | POA: Diagnosis not present

## 2019-12-28 DIAGNOSIS — Z0000 Encounter for general adult medical examination without abnormal findings: Secondary | ICD-10-CM | POA: Diagnosis not present

## 2019-12-28 DIAGNOSIS — J449 Chronic obstructive pulmonary disease, unspecified: Secondary | ICD-10-CM | POA: Diagnosis not present

## 2019-12-28 DIAGNOSIS — Z23 Encounter for immunization: Secondary | ICD-10-CM | POA: Diagnosis not present

## 2019-12-28 DIAGNOSIS — E7849 Other hyperlipidemia: Secondary | ICD-10-CM | POA: Diagnosis not present

## 2020-01-03 DIAGNOSIS — Z0001 Encounter for general adult medical examination with abnormal findings: Secondary | ICD-10-CM | POA: Diagnosis not present

## 2020-01-03 DIAGNOSIS — R011 Cardiac murmur, unspecified: Secondary | ICD-10-CM | POA: Diagnosis not present

## 2020-01-03 DIAGNOSIS — E782 Mixed hyperlipidemia: Secondary | ICD-10-CM | POA: Diagnosis not present

## 2020-01-03 DIAGNOSIS — I10 Essential (primary) hypertension: Secondary | ICD-10-CM | POA: Diagnosis not present

## 2020-01-03 DIAGNOSIS — Z6828 Body mass index (BMI) 28.0-28.9, adult: Secondary | ICD-10-CM | POA: Diagnosis not present

## 2020-01-03 DIAGNOSIS — E663 Overweight: Secondary | ICD-10-CM | POA: Diagnosis not present

## 2020-01-03 DIAGNOSIS — J449 Chronic obstructive pulmonary disease, unspecified: Secondary | ICD-10-CM | POA: Diagnosis not present

## 2020-01-18 DIAGNOSIS — D519 Vitamin B12 deficiency anemia, unspecified: Secondary | ICD-10-CM | POA: Diagnosis not present

## 2020-02-14 DIAGNOSIS — D519 Vitamin B12 deficiency anemia, unspecified: Secondary | ICD-10-CM | POA: Diagnosis not present

## 2020-03-23 ENCOUNTER — Ambulatory Visit: Payer: PPO | Admitting: Cardiology

## 2020-03-26 DIAGNOSIS — D519 Vitamin B12 deficiency anemia, unspecified: Secondary | ICD-10-CM | POA: Diagnosis not present

## 2020-04-24 DIAGNOSIS — D519 Vitamin B12 deficiency anemia, unspecified: Secondary | ICD-10-CM | POA: Diagnosis not present

## 2020-04-27 ENCOUNTER — Encounter: Payer: Self-pay | Admitting: *Deleted

## 2020-04-30 ENCOUNTER — Encounter: Payer: Self-pay | Admitting: *Deleted

## 2020-04-30 ENCOUNTER — Ambulatory Visit: Payer: PPO | Admitting: Cardiology

## 2020-04-30 ENCOUNTER — Encounter: Payer: Self-pay | Admitting: Cardiology

## 2020-04-30 VITALS — BP 130/80 | HR 74 | Ht 68.0 in | Wt 191.2 lb

## 2020-04-30 DIAGNOSIS — E782 Mixed hyperlipidemia: Secondary | ICD-10-CM

## 2020-04-30 DIAGNOSIS — I10 Essential (primary) hypertension: Secondary | ICD-10-CM | POA: Diagnosis not present

## 2020-04-30 DIAGNOSIS — R011 Cardiac murmur, unspecified: Secondary | ICD-10-CM | POA: Diagnosis not present

## 2020-04-30 DIAGNOSIS — R079 Chest pain, unspecified: Secondary | ICD-10-CM | POA: Diagnosis not present

## 2020-04-30 NOTE — Addendum Note (Signed)
Addended by: Julian Hy T on: 04/30/2020 05:13 PM   Modules accepted: Orders

## 2020-04-30 NOTE — Progress Notes (Signed)
Cardiology Office Note  Date: 04/30/2020   ID: Colin Weber, DOB 1946/01/24, MRN [id number]  PCP:  [name], MD  Cardiologist:  [name], MD Electrophysiologist:  None   Chief Complaint  Patient presents with  . Chest Pain  . History of heart murmur    History of Present Illness: Colin Weber is a 75 y.o. male referred for cardiology consultation by Ms. Kari Baars NP with Dr. Nevada Crane for evaluation of chest discomfort and history of heart murmur.  He reports intermittent episodes of exertional chest discomfort preceded by jaw pain and shoulder pain.  He has noticed this for instance when walking up steps from his basement in the evenings, although not regularly when he is walking up steps.  This has been going on since April of last year by his report.  He does have a primary care provider through the [facility] hospital system, and reportedly a history of valvular heart disease although details are not clear.  He has had echocardiograms done in the past and thinks that he has been told he has a "leaky heart valve."  I personally reviewed his ECG today which shows normal sinus rhythm with leftward axis and nonspecific ST changes.  I reviewed his medications which are outlined below.  Lab work from PCP in October 2021 showed LDL 115.  Past Medical History:  Diagnosis Date  . Arthritis   . COPD (chronic obstructive pulmonary disease) ([place])   . Essential hypertension   . GERD (gastroesophageal reflux disease)   . Hyperlipidemia     Past Surgical History:  Procedure Laterality Date  . CATARACT EXTRACTION Bilateral 2010   [place], [place]  . COLONOSCOPY N/A 07/20/2012   Procedure: COLONOSCOPY;  Surgeon: [name], MD;  [facility];  Service: Gastroenterology;  Laterality: N/A;  . COLONOSCOPY N/A 01/01/2016   Procedure: COLONOSCOPY;  Surgeon: [name], MD;  [facility];  Service: Gastroenterology;  Laterality: N/A;     Current Outpatient Medications  Medication Sig Dispense Refill  . atenolol (TENORMIN) 25 MG tablet Take 25 mg by mouth 2 (two) times daily.    . carboxymethylcellulose (REFRESH PLUS) 0.5 % SOLN 1 drop 3 (three) times daily as needed.    . cyanocobalamin (,VITAMIN B-12,) 1000 MCG/ML injection Inject into the muscle.    . fish oil-omega-3 fatty acids 1000 MG capsule Take 1 g by mouth daily.    . nitroGLYCERIN (NITROSTAT) 0.4 MG SL tablet Place 0.4 mg under the tongue every 5 (five) minutes as needed for chest pain.     No current facility-administered medications for this visit.   Allergies:  Hydrocodone, Amoxicillin, and Lac bovis   Social History: The patient  reports that he has quit smoking. His smoking use included cigarettes. He has a 50.00 pack-year smoking history. He has never used smokeless tobacco. He reports previous alcohol use of about 6.0 standard drinks of alcohol per week. He reports current drug use. Drug: Marijuana.   Family History: The patient's family history includes Cancer in his father; Heart disease in his father; Stroke in his father.   ROS: No palpitations or syncope.  Physical Exam: VS:  BP 130/80   Pulse 74   Ht 5\' 8"  (1.727 m)   Wt 191 lb 3.2 oz (86.7 kg)   SpO2 96%   BMI 29.07 kg/m , BMI Body mass index is 29.07 kg/m.  Wt Readings from Last 3 Encounters:  04/30/20 191 lb 3.2  oz (86.7 kg)  01/01/16 176 lb (79.8 kg)  07/20/12 193 lb (87.5 kg)    General: Patient appears comfortable at rest. HEENT: Conjunctiva and lids normal, wearing a mask. Neck: Supple, no elevated JVP, no thyromegaly. Lungs: Clear to auscultation, nonlabored breathing at rest. Cardiac: Regular rate and rhythm, no S3, 3/6 systolic murmur loudest at right sternal border although also heard at the apex, radiates to the carotids, no pericardial rub. Abdomen: Soft, nontender, bowel sounds present. Extremities: No pitting edema, distal pulses 2+. Skin: Warm and  dry. Musculoskeletal: No kyphosis. Neuropsychiatric: Alert and oriented x3, affect grossly appropriate.  ECG:  An ECG dated August 2021 was personally reviewed today and demonstrated:  Normal sinus rhythm with nonspecific ST changes and left anterior fascicular block.  Recent Labwork:  October 2021: Hemoglobin 14.6, platelets 245, BUN 19, creatinine 1.19, potassium 4.4, AST 16, ALT 10, cholesterol 185, triglycerides 208, HDL 33, LDL 115, hemoglobin A1c 5.3%  Other Studies Reviewed Today:  No prior cardiac testing for review.  Assessment and Plan:  1.  Intermittent exertional chest discomfort, also jaw and shoulder discomfort.  Present over the last year as discussed above.  Patient is 75 years old with known history of hypertension and hyperlipidemia as well as COPD.  ECG nonspecific.  Plan is to assess for ischemia via exercise Myoview.  2.  Longstanding heart murmur.  Patient states that he has been told that he has a "leaky heart valve," but murmur is best heard in aortic position and may well reflect aortic stenosis.  We will obtain an echocardiogram for further evaluation.  3.  Essential hypertension, on atenolol.  Systolic 818 today.  4.  Hyperlipidemia, currently on omega-3 supplements.  LDL 115 and triglycerides 208.  Medication Adjustments/Labs and Tests Ordered: Current medicines are reviewed at length with the patient today.  Concerns regarding medicines are outlined above.   Tests Ordered: Orders Placed This Encounter  Procedures  . NM Myocar Multi W/Spect W/Wall Motion / EF  . ECHOCARDIOGRAM COMPLETE    Medication Changes: No orders of the defined types were placed in this encounter.   Disposition:  Follow up test results.  Signed, Satira Sark, MD, [facility] 04/30/2020 3:19 PM    [facility] [place], [place], [place] [address] Phone: [phone]; Fax: [phone]

## 2020-04-30 NOTE — Patient Instructions (Signed)
Your physician recommends that you schedule a follow-up appointment in: PENDING WITH DR MCDOWELL  Your physician recommends that you continue on your current medications as directed. Please refer to the Current Medication list given to you today.  Your physician has requested that you have an echocardiogram. Echocardiography is a painless test that uses sound waves to create images of your heart. It provides your doctor with information about the size and shape of your heart and how well your heart's chambers and valves are working. This procedure takes approximately one hour. There are no restrictions for this procedure.  Your physician has requested that you have a lexiscan myoview. For further information please visit [email or web address]. Please follow instruction sheet, as given.  Thank you for choosing [facility]!!     

## 2020-05-17 DIAGNOSIS — D519 Vitamin B12 deficiency anemia, unspecified: Secondary | ICD-10-CM | POA: Diagnosis not present

## 2020-05-23 ENCOUNTER — Ambulatory Visit (HOSPITAL_COMMUNITY)
Admission: RE | Admit: 2020-05-23 | Discharge: 2020-05-23 | Disposition: A | Discharge status: Home / Self Care | Payer: PPO | Source: Ambulatory Visit | Attending: Cardiology | Admitting: Cardiology

## 2020-05-23 ENCOUNTER — Other Ambulatory Visit: Payer: Self-pay

## 2020-05-23 ENCOUNTER — Encounter (HOSPITAL_BASED_OUTPATIENT_CLINIC_OR_DEPARTMENT_OTHER)
Admission: RE | Admit: 2020-05-23 | Discharge: 2020-05-23 | Disposition: A | Discharge status: Home / Self Care | Payer: PPO | Source: Ambulatory Visit | Attending: Cardiology | Admitting: Cardiology

## 2020-05-23 ENCOUNTER — Encounter (HOSPITAL_COMMUNITY)
Admission: RE | Admit: 2020-05-23 | Discharge: 2020-05-23 | Disposition: A | Discharge status: Home / Self Care | Payer: PPO | Source: Ambulatory Visit | Attending: Cardiology | Admitting: Cardiology

## 2020-05-23 ENCOUNTER — Encounter (HOSPITAL_COMMUNITY): Payer: Self-pay

## 2020-05-23 DIAGNOSIS — K219 Gastro-esophageal reflux disease without esophagitis: Secondary | ICD-10-CM | POA: Insufficient documentation

## 2020-05-23 DIAGNOSIS — I350 Nonrheumatic aortic (valve) stenosis: Secondary | ICD-10-CM | POA: Insufficient documentation

## 2020-05-23 DIAGNOSIS — R011 Cardiac murmur, unspecified: Secondary | ICD-10-CM | POA: Insufficient documentation

## 2020-05-23 DIAGNOSIS — J449 Chronic obstructive pulmonary disease, unspecified: Secondary | ICD-10-CM | POA: Insufficient documentation

## 2020-05-23 DIAGNOSIS — I10 Essential (primary) hypertension: Secondary | ICD-10-CM | POA: Insufficient documentation

## 2020-05-23 DIAGNOSIS — R079 Chest pain, unspecified: Secondary | ICD-10-CM | POA: Insufficient documentation

## 2020-05-23 DIAGNOSIS — E785 Hyperlipidemia, unspecified: Secondary | ICD-10-CM | POA: Insufficient documentation

## 2020-05-23 HISTORY — DX: Unspecified asthma, uncomplicated: J45.909

## 2020-05-23 LAB — ECHOCARDIOGRAM COMPLETE
AR max vel: 1.24 cm2
AV Area VTI: 1.36 cm2
AV Area mean vel: 1.23 cm2
AV Mean grad: 28 mmHg
AV Peak grad: 47.3 mmHg
Ao pk vel: 3.44 m/s
Area-P 1/2: 3.43 cm2
S' Lateral: 2.5 cm

## 2020-05-23 LAB — NM MYOCAR MULTI W/SPECT W/WALL MOTION / EF

## 2020-05-23 MED ORDER — REGADENOSON 0.4 MG/5ML IV SOLN
INTRAVENOUS | Status: AC
  Administered 2020-05-23: 0.4 mg via INTRAVENOUS
  Filled 2020-05-23: qty 5

## 2020-05-23 MED ORDER — TECHNETIUM TC 99M TETROFOSMIN IV KIT
10.0000 | PACK | Freq: Once | INTRAVENOUS | Status: AC | PRN
  Administered 2020-05-23: 10.6 via INTRAVENOUS

## 2020-05-23 MED ORDER — PERFLUTREN LIPID MICROSPHERE
1.0000 mL | INTRAVENOUS | Status: AC | PRN
  Administered 2020-05-23: 2 mL via INTRAVENOUS
  Filled 2020-05-23: qty 10

## 2020-05-23 MED ORDER — TECHNETIUM TC 99M TETROFOSMIN IV KIT
30.0000 | PACK | Freq: Once | INTRAVENOUS | Status: AC | PRN
Start: 2020-05-23 — End: 2020-05-23
  Administered 2020-05-23: 32 via INTRAVENOUS

## 2020-05-23 MED ORDER — SODIUM CHLORIDE FLUSH 0.9 % IV SOLN
INTRAVENOUS | Status: AC
  Administered 2020-05-23: 10 mL via INTRAVENOUS
  Filled 2020-05-23: qty 10

## 2020-05-23 NOTE — Progress Notes (Addendum)
*  PRELIMINARY RESULTS* Echocardiogram 2D Echocardiogram has been performed with Definity.  Samuel Germany 05/23/2020, 10:30 AM

## 2020-05-24 LAB — NM MYOCAR MULTI W/SPECT W/WALL MOTION / EF
LV dias vol: 111 mL (ref 62–150)
LV sys vol: 39 mL
Peak HR: 90 {beats}/min
RATE: 0.36
Rest HR: 75 {beats}/min
SDS: 4
SRS: 13
SSS: 17
TID: 0.97

## 2020-05-25 ENCOUNTER — Telehealth: Payer: Self-pay | Admitting: *Deleted

## 2020-05-25 MED ORDER — ROSUVASTATIN CALCIUM 10 MG PO TABS: 10.0000 mg | ORAL_TABLET | Freq: Every day | ORAL | 1 refills | Status: DC

## 2020-05-25 MED ORDER — ASPIRIN EC 81 MG PO TBEC: 81.0000 mg | DELAYED_RELEASE_TABLET | Freq: Every day | ORAL | 3 refills | Status: AC

## 2020-05-25 MED ORDER — ISOSORBIDE MONONITRATE ER 30 MG PO TB24: 30.0000 mg | ORAL_TABLET | Freq: Every evening | ORAL | 1 refills | Status: DC

## 2020-05-25 NOTE — Telephone Encounter (Signed)
-----   Message from Satira Sark, MD sent at 05/23/2020  2:12 PM EDT ----- Results reviewed.  LVEF normal at 60 to 65%.  Aortic valve is severely calcified with evidence of moderate aortic stenosis, mean gradient 28 mmHg.  This would be more consistent with his heart murmur by recent examination.  Await results of pending stress test for further recommendations.

## 2020-05-25 NOTE — Telephone Encounter (Signed)
Patient informed and verbalized understanding of plan. Copy sent to PCP 

## 2020-05-25 NOTE — Telephone Encounter (Signed)
-----   Message from Satira Sark, MD sent at 05/24/2020  4:46 PM EDT ----- Results reviewed.  Please let him know that the stress test is consistent with underlying ischemic heart disease, probable previous inferolateral infarct scar suggesting prior heart attack at some point.  Mild peri-infarct ischemia would correlate with intermittent symptoms that he is describing.  He also has moderate calcific aortic stenosis as well by recent echocardiogram.  We are going to need to follow him more closely.  Suggest that he start aspirin 81 mg daily, Imdur 30 mg once in the evening, and Crestor 10 mg daily.  This rounds out his current medications for treatment of ischemic heart disease and hopefully will improve his symptoms.  If medical therapy is not sufficient, cardiac catheterization would be our next step.  Schedule a follow-up visit in about 6 weeks to see how he is doing.

## 2020-05-30 DIAGNOSIS — I10 Essential (primary) hypertension: Secondary | ICD-10-CM | POA: Diagnosis not present

## 2020-05-30 DIAGNOSIS — E1165 Type 2 diabetes mellitus with hyperglycemia: Secondary | ICD-10-CM | POA: Diagnosis not present

## 2020-06-19 DIAGNOSIS — Z6828 Body mass index (BMI) 28.0-28.9, adult: Secondary | ICD-10-CM | POA: Diagnosis not present

## 2020-06-19 DIAGNOSIS — I10 Essential (primary) hypertension: Secondary | ICD-10-CM | POA: Diagnosis not present

## 2020-06-19 DIAGNOSIS — N410 Acute prostatitis: Secondary | ICD-10-CM | POA: Diagnosis not present

## 2020-06-19 DIAGNOSIS — Z6829 Body mass index (BMI) 29.0-29.9, adult: Secondary | ICD-10-CM | POA: Diagnosis not present

## 2020-06-19 DIAGNOSIS — Z0000 Encounter for general adult medical examination without abnormal findings: Secondary | ICD-10-CM | POA: Diagnosis not present

## 2020-06-19 DIAGNOSIS — E782 Mixed hyperlipidemia: Secondary | ICD-10-CM | POA: Diagnosis not present

## 2020-06-19 DIAGNOSIS — E1165 Type 2 diabetes mellitus with hyperglycemia: Secondary | ICD-10-CM | POA: Diagnosis not present

## 2020-06-19 DIAGNOSIS — D519 Vitamin B12 deficiency anemia, unspecified: Secondary | ICD-10-CM | POA: Diagnosis not present

## 2020-06-28 DIAGNOSIS — I350 Nonrheumatic aortic (valve) stenosis: Secondary | ICD-10-CM | POA: Diagnosis not present

## 2020-06-28 DIAGNOSIS — J449 Chronic obstructive pulmonary disease, unspecified: Secondary | ICD-10-CM | POA: Diagnosis not present

## 2020-06-28 DIAGNOSIS — I25111 Atherosclerotic heart disease of native coronary artery with angina pectoris with documented spasm: Secondary | ICD-10-CM | POA: Diagnosis not present

## 2020-06-28 DIAGNOSIS — D519 Vitamin B12 deficiency anemia, unspecified: Secondary | ICD-10-CM | POA: Diagnosis not present

## 2020-06-28 DIAGNOSIS — E663 Overweight: Secondary | ICD-10-CM | POA: Diagnosis not present

## 2020-06-28 DIAGNOSIS — I10 Essential (primary) hypertension: Secondary | ICD-10-CM | POA: Diagnosis not present

## 2020-06-28 DIAGNOSIS — Z6828 Body mass index (BMI) 28.0-28.9, adult: Secondary | ICD-10-CM | POA: Diagnosis not present

## 2020-06-28 DIAGNOSIS — E782 Mixed hyperlipidemia: Secondary | ICD-10-CM | POA: Diagnosis not present

## 2020-06-28 DIAGNOSIS — R011 Cardiac murmur, unspecified: Secondary | ICD-10-CM | POA: Diagnosis not present

## 2020-07-06 ENCOUNTER — Ambulatory Visit: Payer: PPO | Admitting: Nurse Practitioner

## 2020-07-06 ENCOUNTER — Other Ambulatory Visit: Payer: Self-pay

## 2020-07-06 ENCOUNTER — Encounter: Payer: Self-pay | Admitting: Nurse Practitioner

## 2020-07-06 VITALS — BP 150/80 | HR 71 | Ht 68.0 in | Wt 190.0 lb

## 2020-07-06 DIAGNOSIS — E782 Mixed hyperlipidemia: Secondary | ICD-10-CM

## 2020-07-06 DIAGNOSIS — I350 Nonrheumatic aortic (valve) stenosis: Secondary | ICD-10-CM | POA: Diagnosis not present

## 2020-07-06 DIAGNOSIS — I10 Essential (primary) hypertension: Secondary | ICD-10-CM

## 2020-07-06 DIAGNOSIS — I208 Other forms of angina pectoris: Secondary | ICD-10-CM | POA: Diagnosis not present

## 2020-07-06 DIAGNOSIS — R9439 Abnormal result of other cardiovascular function study: Secondary | ICD-10-CM

## 2020-07-06 MED ORDER — ISOSORBIDE MONONITRATE ER 60 MG PO TB24: 60.0000 mg | ORAL_TABLET | Freq: Every day | ORAL | 3 refills | Status: DC

## 2020-07-06 NOTE — Patient Instructions (Signed)
Medication Instructions:  Your physician has recommended you make the following change in your medication:   Increase Imdur to 60 mg Daily   *If you need a refill on your cardiac medications before your next appointment, please call your pharmacy*   Lab Work: NONE   If you have labs (blood work) drawn today and your tests are completely normal, you will receive your results only by: Marland Kitchen MyChart Message (if you have MyChart) OR . A paper copy in the mail If you have any lab test that is abnormal or we need to change your treatment, we will call you to review the results.   Testing/Procedures: NONE    Follow-Up: At [facility], you and your health needs are our priority.  As part of our continuing mission to provide you with exceptional heart care, we have created designated Provider Care Teams.  These Care Teams include your primary Cardiologist (physician) and Advanced Practice Providers (APPs -  Physician Assistants and Nurse Practitioners) who all work together to provide you with the care you need, when you need it.  We recommend signing up for the patient portal called "MyChart".  Sign up information is provided on this After Visit Summary.  MyChart is used to connect with patients for Virtual Visits (Telemedicine).  Patients are able to view lab/test results, encounter notes, upcoming appointments, etc.  Non-urgent messages can be sent to your provider as well.   To learn more about what you can do with MyChart, go to [email or web address].    Your next appointment:   1 month(s)  The format for your next appointment:   In Person  Provider:   [name], MD, Bernerd Pho, PA-C or Ermalinda Barrios, PA-C   Other Instructions Thank you for choosing [facility]!

## 2020-07-06 NOTE — Progress Notes (Signed)
Office Visit    Patient Name: Colin Weber Date of Encounter: 07/06/2020  Primary Care Provider:  [name], MD Primary Cardiologist:  [name], MD  Chief Complaint    75 year old male with a history of hypertension, hyperlipidemia, GERD, COPD, and arthritis, who presents for follow-up after recent evaluation for chest pain and findings of aortic stenosis and abnormal stress test.  Past Medical History    Past Medical History:  Diagnosis Date  . Aortic stenosis    a. 05/2020 Echo: EF 60-65%, no rwma, Gr1 DD, nl RV size/fxn. Mod dil LA. Sev Ca2+ of AoV w/ Mod AS (mean/peak grad 28/47.3 mmHg; AVA 1.36cm^2 by VTI).  . Arthritis   . Asthma   . Chest pain    a. 05/2020 MV: Large prior inferolateral infarct w/ mild peri-infarct ischemia. EF 55-65%.  Marland Kitchen COPD (chronic obstructive pulmonary disease) ([place])   . Essential hypertension   . GERD (gastroesophageal reflux disease)   . Hyperlipidemia    Past Surgical History:  Procedure Laterality Date  . CATARACT EXTRACTION Bilateral 2010   [place], [place]  . COLONOSCOPY N/A 07/20/2012   Procedure: COLONOSCOPY;  Surgeon: [name], MD;  [facility];  Service: Gastroenterology;  Laterality: N/A;  . COLONOSCOPY N/A 01/01/2016   Procedure: COLONOSCOPY;  Surgeon: [name], MD;  [facility];  Service: Gastroenterology;  Laterality: N/A;    Allergies  Allergies  Allergen Reactions  . Hydrocodone Nausea And Vomiting  . Amoxicillin Rash  . Lac Bovis Rash    History of Present Illness    75 year old male with above past medical history including hypertension, hyperlipidemia, GERD, COPD, and arthritis.  He has been followed in the [place] medical system and previously told that he had a "leaky heart valve."  He establish care with [name] in February of this year in the setting of episodic exertional chest discomfort preceded by jaw pain and shoulder pain.  Symptoms are  typical lasting a few minutes and resolving with rest.  He subsequently underwent echocardiography which showed an EF of 78-29%, grade 1 diastolic dysfunction, and moderate aortic stenosis in the setting of severe calcification of the aortic valve with a mean/peak gradient of 28.0/47.3 mmHg respectively and an aortic valve area of 1.36 cm (VTI).  Stress testing was also undertaken and showed a large, prior inferolateral infarct with mild peri-infarct ischemia and an EF of 55-65%.  Patient was subsequently placed on aspirin, isosorbide mononitrate, and statin therapy with recommendation for follow-up and consideration for diagnostic catheterization for ongoing symptoms.  Since his last visit, Mr. Dora has noted some improvement in frequency and severity of chest pain Ss.  He still notes chest discomfort about 1-2 times per day, typically with activity or when "getting excited," associated dyspnea, but symptoms are less severe and lasted fewer minutes.  Overall, he is happy with the impact that isosorbide therapy has made.  He notes that he recently had follow-up lab work including lipids and LFTs.  LFTs were normal and LDL was 46.  Home Medications    Prior to Admission medications   Medication Sig Start Date End Date Taking? Authorizing Provider  aspirin EC 81 MG tablet Take 1 tablet (81 mg total) by mouth daily. Swallow whole. 05/25/20   Satira Sark, MD  atenolol (TENORMIN) 25 MG tablet Take 25 mg by mouth 2 (two) times daily.    [provider]  carboxymethylcellulose (REFRESH PLUS) 0.5 % SOLN  1 drop 3 (three) times daily as needed.    [provider]  cyanocobalamin (,VITAMIN B-12,) 1000 MCG/ML injection Inject into the muscle. 12/21/19   [provider]  fish oil-omega-3 fatty acids 1000 MG capsule Take 1 g by mouth daily.    [provider]  isosorbide mononitrate (IMDUR) 30 MG 24 hr tablet Take 1 tablet (30 mg total) by mouth every evening. 05/25/20    Satira Sark, MD  nitroGLYCERIN (NITROSTAT) 0.4 MG SL tablet Place 0.4 mg under the tongue every 5 (five) minutes as needed for chest pain.    [provider]  rosuvastatin (CRESTOR) 10 MG tablet Take 1 tablet (10 mg total) by mouth at bedtime. 05/25/20   Satira Sark, MD    Review of Systems    Overall, doing better following initiation of isosorbide therapy though continues to have mild exertional chest discomfort 1-2 times per day.  He does not see this is particular lifestyle limiting at this point.  This is associated dyspnea.  He denies palpitations, PND, orthopnea, dizziness, syncope, edema, or early satiety.  All other systems reviewed and are otherwise negative except as noted above.  Physical Exam    VS:  BP (!) 150/80   Pulse 71   Ht 5\' 8"  (1.727 m)   Wt 190 lb (86.2 kg)   SpO2 96%   BMI 28.89 kg/m  , BMI Body mass index is 28.89 kg/m. GEN: Well nourished, well developed, in no acute distress. HEENT: normal. Neck: Supple, no JVD.  There is a radiated murmur over the right carotid.  No masses. Cardiac: RRR, 2/6 systolic murmur loudest at the upper sternal borders and radiating to the right neck.  The murmur is heard throughout.  Rubs, or gallops. No clubbing, cyanosis, edema.  Radials/PT 2+ and equal bilaterally.  Respiratory:  Respirations regular and unlabored, clear to auscultation bilaterally. GI: Soft, nontender, nondistended, BS + x 4. MS: no deformity or atrophy. Skin: warm and dry, no rash. Neuro:  Strength and sensation are intact. Psych: Normal affect.  Accessory Clinical Findings    Echocardiogram and stress testing reviewed today and past medical history updated with results.  Labs dated June 19, 2020 reviewed and are as follows:  Hemoglobin 14.8, hematocrit 45, WBC 8.7, platelets 244 Sodium 140, potassium 4.5, chloride 102, CO2 22, BUN 18, creatinine 1.01, glucose 88 Total protein 7.0, albumin 4.3, calcium 9.5 Total bilirubin 0.4,  alkaline phosphatase 44, AST 23, ALT 17 Total cholesterol 111, triglycerides 166, HDL 37, LDL 46 Hemoglobin A1c 5.4  Assessment & Plan    1.  Stable angina/abnormal stress test: Patient evaluated in March secondary to intermittent exertional angina.  Stress testing was undertaken and notable for large prior inferolateral infarct with mild peri-infarct ischemia and normal LV function.  Echocardiography showed normal LV function, grade 1 diastolic dysfunction, and moderate aortic stenosis.  Patient was placed on isosorbide, Crestor, and aspirin therapy.  He has noted significant improvement in symptoms, specifically improved severity, frequency, and duration, though he still experiences exertional chest pain about 1-2 times per day, which resolves with rest.  We discussed his stress test and echocardiogram results in detail today as well as potential options for ongoing management.  His preference would be to continue conservative therapy and as such, we will increase isosorbide to 60 mg daily.  We will plan to see him back in approximately 1 month to reevaluate symptoms.  2.  Essential hypertension: Blood pressure elevated in clinic today however,  he checks this daily at home and notes that he is typically in the 120-130 range.  He remains on beta-blocker therapy and as above, I am titrating nitrate.  3.  Hyperlipidemia: Recently placed on rosuvastatin in the setting of abnormal stress test.  Follow-up labs in April showed an LDL of 46.  Patient is tolerating well and LFTs were normal at that time.  4.  Moderate aortic stenosis: Patient has a long history of being told he has a heart murmur though he thought he had mitral regurgitation.  Recent echo showed moderate aortic stenosis with a valve area of 1.36 cm and a mean/peak gradient of 28 and 47.3 mmHg respectively.  We discussed echo results today with plan to follow-up echo in approximately 1 year.  5.  Disposition: Follow-up in 1 month or sooner if  necessary.  Murray Hodgkins, NP 07/06/2020, 1:06 PM

## 2020-07-26 DIAGNOSIS — E538 Deficiency of other specified B group vitamins: Secondary | ICD-10-CM | POA: Diagnosis not present

## 2020-07-26 DIAGNOSIS — D519 Vitamin B12 deficiency anemia, unspecified: Secondary | ICD-10-CM | POA: Diagnosis not present

## 2020-08-23 DIAGNOSIS — E538 Deficiency of other specified B group vitamins: Secondary | ICD-10-CM | POA: Diagnosis not present

## 2020-08-23 DIAGNOSIS — D559 Anemia due to enzyme disorder, unspecified: Secondary | ICD-10-CM | POA: Diagnosis not present

## 2020-08-30 ENCOUNTER — Other Ambulatory Visit: Payer: Self-pay

## 2020-08-30 ENCOUNTER — Ambulatory Visit: Payer: PPO | Admitting: Cardiology

## 2020-08-30 ENCOUNTER — Encounter: Payer: Self-pay | Admitting: Cardiology

## 2020-08-30 VITALS — BP 142/80 | HR 76 | Ht 68.0 in | Wt 191.8 lb

## 2020-08-30 DIAGNOSIS — I350 Nonrheumatic aortic (valve) stenosis: Secondary | ICD-10-CM

## 2020-08-30 DIAGNOSIS — I25119 Atherosclerotic heart disease of native coronary artery with unspecified angina pectoris: Secondary | ICD-10-CM

## 2020-08-30 NOTE — Patient Instructions (Signed)

## 2020-08-30 NOTE — Progress Notes (Signed)
Cardiology Office Note  Date: 08/30/2020   ID: Colin Weber, DOB February 12, 1946, MRN [id number]  PCP:  [name], MD  Cardiologist:  [name], MD Electrophysiologist:  None   Chief Complaint  Patient presents with   Cardiac follow-up    History of Present Illness: Colin Weber is a 75 y.o. male last seen in May by Mr. Colin Douglas NP.  He is here for a follow-up visit.  States that he is feeling much better in terms of angina control, has more stamina since medical therapy has been modified.  Imdur was increased to 60 mg daily after the last visit.  Cardiac structural and ischemic testing reviewed and outlined below.  He has evidence of moderate aortic stenosis and also underlying ischemic heart disease with inferolateral infarct scar associated with mild peri-infarct ischemia.  I reviewed his current medications which are noted below.  He reports compliance with therapy.  Past Medical History:  Diagnosis Date   Aortic stenosis    a. 05/2020 Echo: EF 60-65%, no rwma, Gr1 DD, nl RV size/fxn. Mod dil LA. Sev Ca2+ of AoV w/ Mod AS (mean/peak grad 28/47.3 mmHg; AVA 1.36cm^2 by VTI).   Arthritis    Asthma    Chest pain    a. 05/2020 MV: Large prior inferolateral infarct w/ mild peri-infarct ischemia. EF 55-65%.   COPD (chronic obstructive pulmonary disease) ([place])    Essential hypertension    GERD (gastroesophageal reflux disease)    Hyperlipidemia     Past Surgical History:  Procedure Laterality Date   CATARACT EXTRACTION Bilateral 2010   [place], [place]   COLONOSCOPY N/A 07/20/2012   Procedure: COLONOSCOPY;  Surgeon: [name], MD;  [facility];  Service: Gastroenterology;  Laterality: N/A;   COLONOSCOPY N/A 01/01/2016   Procedure: COLONOSCOPY;  Surgeon: [name], MD;  [facility];  Service: Gastroenterology;  Laterality: N/A;    Current Outpatient Medications  Medication Sig Dispense Refill   aspirin EC 81 MG  tablet Take 1 tablet (81 mg total) by mouth daily. Swallow whole. 90 tablet 3   atenolol (TENORMIN) 25 MG tablet Take 25 mg by mouth 2 (two) times daily.     carboxymethylcellulose (REFRESH PLUS) 0.5 % SOLN 1 drop 3 (three) times daily as needed.     Coenzyme Q10 (CO Q-10) 100 MG CAPS Take by mouth.     cyanocobalamin (,VITAMIN B-12,) 1000 MCG/ML injection Inject into the muscle every 30 (thirty) days.     fish oil-omega-3 fatty acids 1000 MG capsule Take 1 g by mouth daily.     isosorbide mononitrate (IMDUR) 60 MG 24 hr tablet Take 1 tablet (60 mg total) by mouth daily. 90 tablet 3   nitroGLYCERIN (NITROSTAT) 0.4 MG SL tablet Place 0.4 mg under the tongue every 5 (five) minutes as needed for chest pain.     rosuvastatin (CRESTOR) 10 MG tablet Take 1 tablet (10 mg total) by mouth at bedtime. 90 tablet 1   VITAMIN D, ERGOCALCIFEROL, PO Take by mouth.     No current facility-administered medications for this visit.   Allergies:  Hydrocodone, Amoxicillin, and Lac bovis   ROS: No dizziness or syncope.  Physical Exam: VS:  BP (!) 142/80   Pulse 76   Ht 5\' 8"  (1.727 m)   Wt 191 lb 12.8 oz (87 kg)   SpO2 95%   BMI 29.16 kg/m , BMI Body mass index is 29.16 kg/m.  Wt Readings  from Last 3 Encounters:  08/30/20 191 lb 12.8 oz (87 kg)  07/06/20 190 lb (86.2 kg)  04/30/20 191 lb 3.2 oz (86.7 kg)    General: Patient appears comfortable at rest. HEENT: Conjunctiva and lids normal, wearing a mask. Neck: Supple, no elevated JVP or carotid bruits, no thyromegaly. Lungs: Clear to auscultation, nonlabored breathing at rest. Cardiac: Regular rate and rhythm, no S3, 3/6 systolic murmur, no pericardial rub. Extremities: No pitting edema.  ECG:  An ECG dated 04/30/2020 was personally reviewed today and demonstrated:  Sinus rhythm with leftward axis and nonspecific ST changes.  Recent Labwork:  April 2022: Hemoglobin 14.8, platelets 244, BUN 18, creatinine 1.01, potassium 4.5, AST 23, ALT 17,  cholesterol 111, triglycerides 166, HDL 37, LDL 46, hemoglobin A1c 5.4%  Other Studies Reviewed Today:  Echocardiogram 05/23/2020:  1. Left ventricular ejection fraction, by estimation, is 60 to 65%. The  left ventricle has normal function. The left ventricle has no regional  wall motion abnormalities. Left ventricular diastolic parameters are  consistent with Grade I diastolic  dysfunction (impaired relaxation).   2. Right ventricular systolic function is normal. The right ventricular  size is normal.   3. Left atrial size was moderately dilated.   4. The mitral valve is normal in structure. No evidence of mitral valve  regurgitation. No evidence of mitral stenosis.   5. The aortic valve has an indeterminant number of cusps. There is severe  calcifcation of the aortic valve. There is severe thickening of the aortic  valve. Aortic valve regurgitation is not visualized. Moderate aortic valve  stenosis.Aortic valve mean  gradient measures 28.0 mmHg. Aortic valve peak gradient measures 47.3  mmHg. Aortic valve area, by VTI measures 1.36 cm.   6. The inferior vena cava is normal in size with greater than 50%  respiratory variability, suggesting right atrial pressure of 3 mmHg.   Lexiscan Myoview 05/23/2020: There was no ST segment deviation noted during stress. Findings consistent with large prior inferolateral myocardial infarction with mild peri-infarct ischemia. This is a low risk study. The left ventricular ejection fraction is normal (55-65%).  Assessment and Plan:  1.  Moderate calcific aortic stenosis with mean gradient 28 mmHg.  Plan to continue observation for now.  2.  Ischemic heart disease based on Lexiscan Myoview in March with evidence of large inferolateral infarct scar and mild peri-infarct ischemia.  He reports significant improvement in angina symptoms and stamina following medication adjustments and remains comfortable with observation for now.  We have discussed  cardiac catheterization if symptoms worsen.  Continue aspirin, Imdur, atenolol, and Crestor.  Medication Adjustments/Labs and Tests Ordered: Current medicines are reviewed at length with the patient today.  Concerns regarding medicines are outlined above.   Tests Ordered: No orders of the defined types were placed in this encounter.   Medication Changes: No orders of the defined types were placed in this encounter.   Disposition:  Follow up  6 months, sooner if needed.  Signed, Satira Sark, MD, [facility] 08/30/2020 10:08 AM    [facility]. [address] [place], [place] [address] Phone: [phone]; Fax: [phone]

## 2020-09-06 DIAGNOSIS — Z961 Presence of intraocular lens: Secondary | ICD-10-CM | POA: Diagnosis not present

## 2020-09-06 DIAGNOSIS — H524 Presbyopia: Secondary | ICD-10-CM | POA: Diagnosis not present

## 2020-09-20 DIAGNOSIS — E538 Deficiency of other specified B group vitamins: Secondary | ICD-10-CM | POA: Diagnosis not present

## 2020-09-20 DIAGNOSIS — D519 Vitamin B12 deficiency anemia, unspecified: Secondary | ICD-10-CM | POA: Diagnosis not present

## 2020-10-25 DIAGNOSIS — D519 Vitamin B12 deficiency anemia, unspecified: Secondary | ICD-10-CM | POA: Diagnosis not present

## 2020-11-16 ENCOUNTER — Other Ambulatory Visit: Payer: Self-pay | Admitting: Cardiology

## 2020-11-22 DIAGNOSIS — D519 Vitamin B12 deficiency anemia, unspecified: Secondary | ICD-10-CM | POA: Diagnosis not present

## 2020-12-12 DIAGNOSIS — Z23 Encounter for immunization: Secondary | ICD-10-CM | POA: Diagnosis not present

## 2020-12-21 DIAGNOSIS — Z9889 Other specified postprocedural states: Secondary | ICD-10-CM | POA: Diagnosis not present

## 2020-12-21 DIAGNOSIS — D519 Vitamin B12 deficiency anemia, unspecified: Secondary | ICD-10-CM | POA: Diagnosis not present

## 2021-01-03 DIAGNOSIS — I10 Essential (primary) hypertension: Secondary | ICD-10-CM | POA: Diagnosis not present

## 2021-01-03 DIAGNOSIS — E119 Type 2 diabetes mellitus without complications: Secondary | ICD-10-CM | POA: Diagnosis not present

## 2021-01-08 DIAGNOSIS — Z6828 Body mass index (BMI) 28.0-28.9, adult: Secondary | ICD-10-CM | POA: Diagnosis not present

## 2021-01-08 DIAGNOSIS — E538 Deficiency of other specified B group vitamins: Secondary | ICD-10-CM | POA: Diagnosis not present

## 2021-01-08 DIAGNOSIS — I10 Essential (primary) hypertension: Secondary | ICD-10-CM | POA: Diagnosis not present

## 2021-01-08 DIAGNOSIS — I350 Nonrheumatic aortic (valve) stenosis: Secondary | ICD-10-CM | POA: Diagnosis not present

## 2021-01-08 DIAGNOSIS — E782 Mixed hyperlipidemia: Secondary | ICD-10-CM | POA: Diagnosis not present

## 2021-01-08 DIAGNOSIS — I2510 Atherosclerotic heart disease of native coronary artery without angina pectoris: Secondary | ICD-10-CM | POA: Diagnosis not present

## 2021-01-08 DIAGNOSIS — J449 Chronic obstructive pulmonary disease, unspecified: Secondary | ICD-10-CM | POA: Diagnosis not present

## 2021-01-08 DIAGNOSIS — Z0001 Encounter for general adult medical examination with abnormal findings: Secondary | ICD-10-CM | POA: Diagnosis not present

## 2021-01-22 DIAGNOSIS — E538 Deficiency of other specified B group vitamins: Secondary | ICD-10-CM | POA: Diagnosis not present

## 2021-02-21 DIAGNOSIS — D519 Vitamin B12 deficiency anemia, unspecified: Secondary | ICD-10-CM | POA: Diagnosis not present

## 2021-02-21 DIAGNOSIS — Z9889 Other specified postprocedural states: Secondary | ICD-10-CM | POA: Diagnosis not present

## 2021-03-25 IMAGING — US ULTRASOUND ABDOMEN LIMITED
1 series · 14 of 25 positions shown · non-contrast
Comparison: None.

CLINICAL DATA: Right upper quadrant abdominal pain for 3 months.

EXAM:
ULTRASOUND ABDOMEN LIMITED RIGHT UPPER QUADRANT

[Series 1: ultrasound abdomen limited · 0.19mm/px · 14 of 91 slices shown]
[im 1/91]
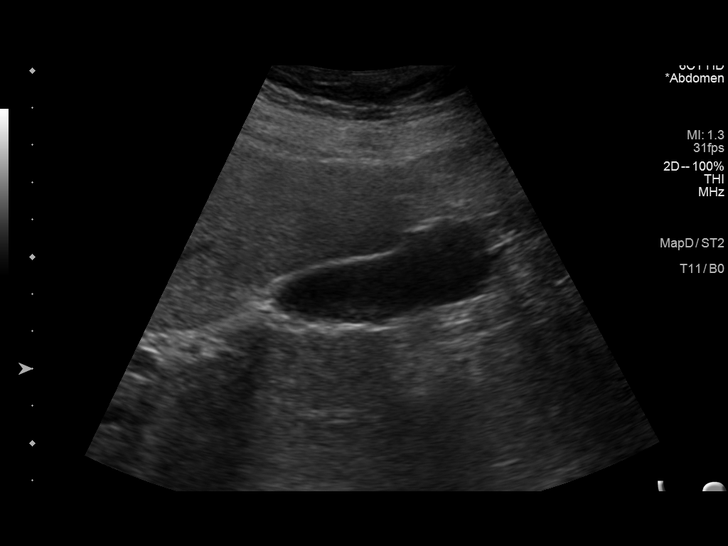
[im 8/91]
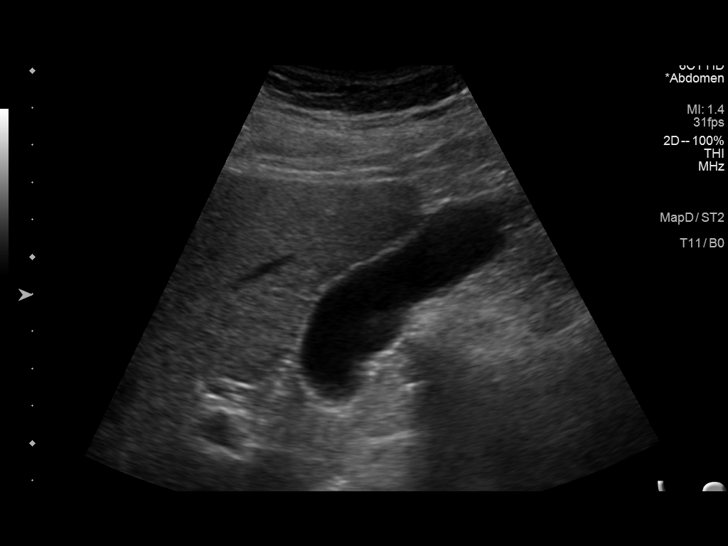
[im 16/91]
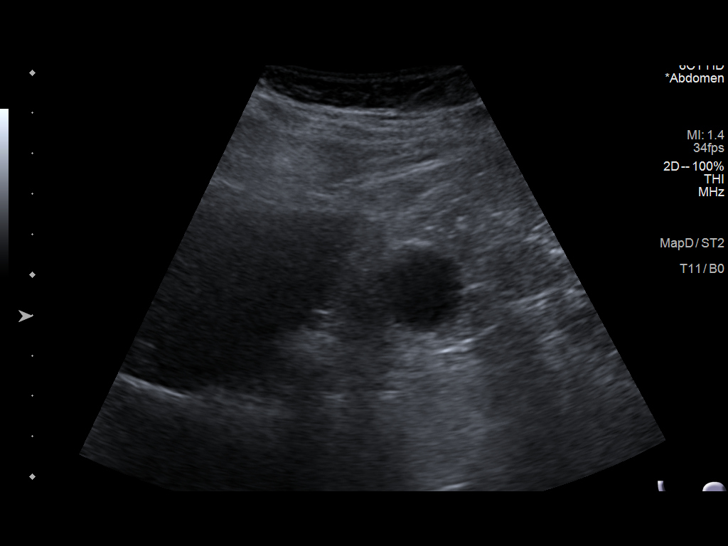
[im 23/91]
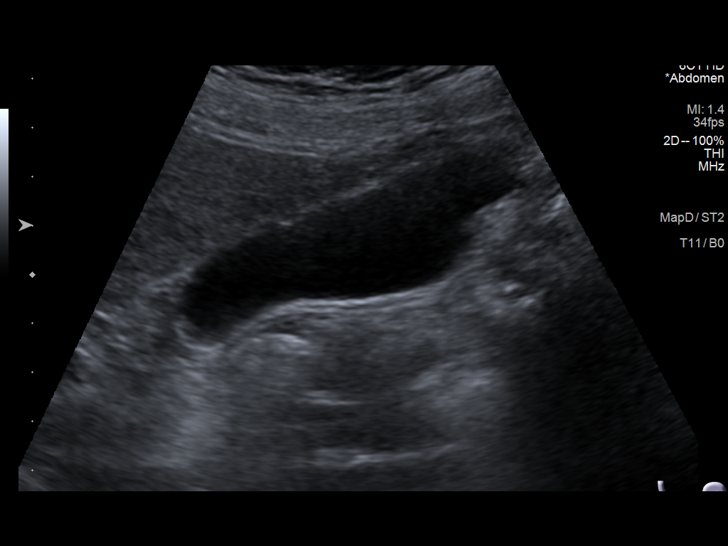
[im 31/91]
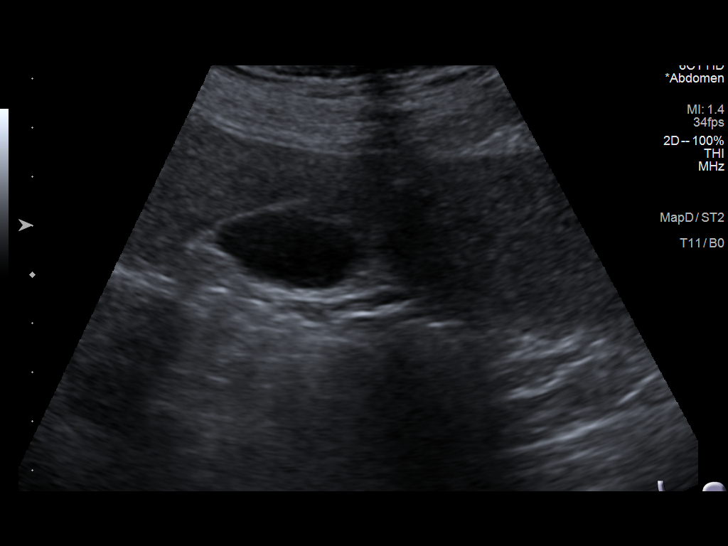
[im 34/91]
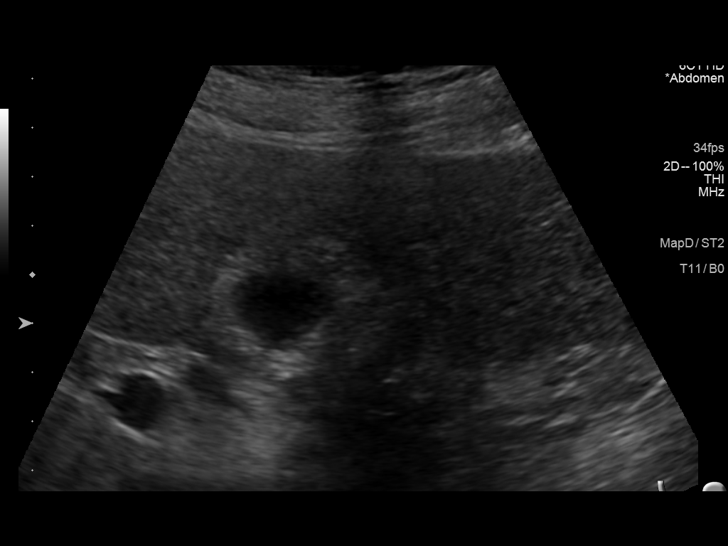
[im 42/91]
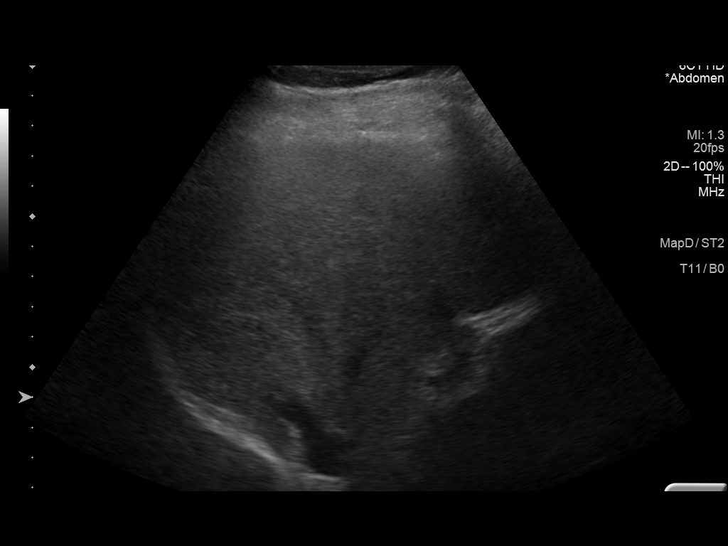
[im 49/91]
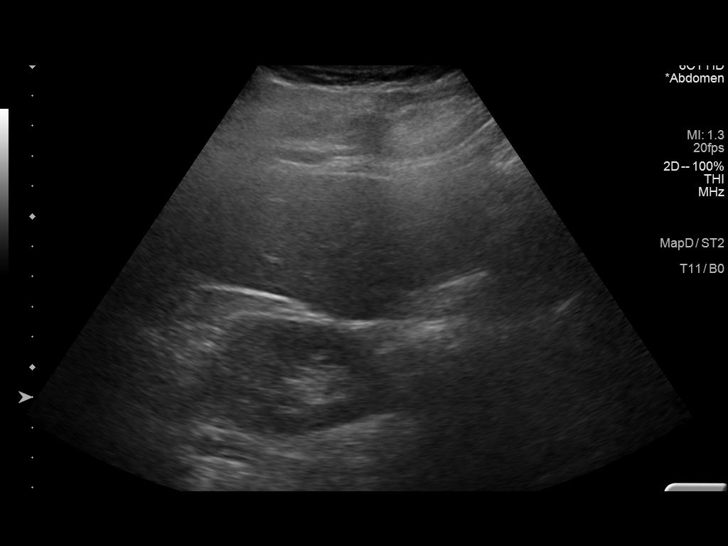
[im 57/91]
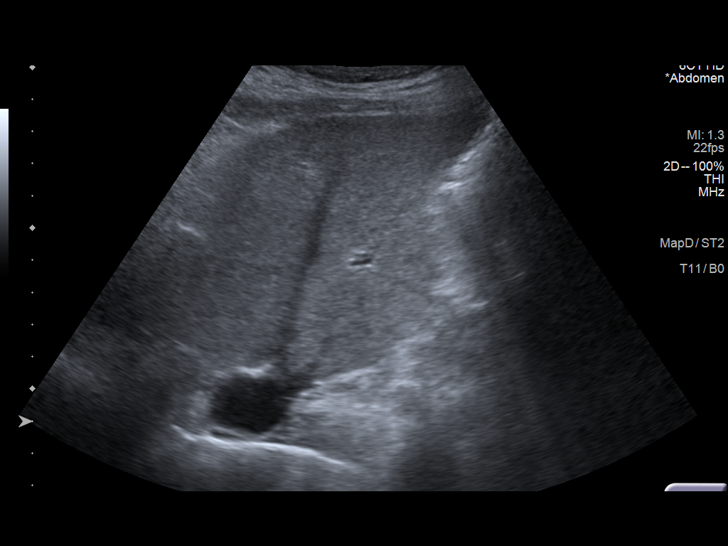
[im 61/91]
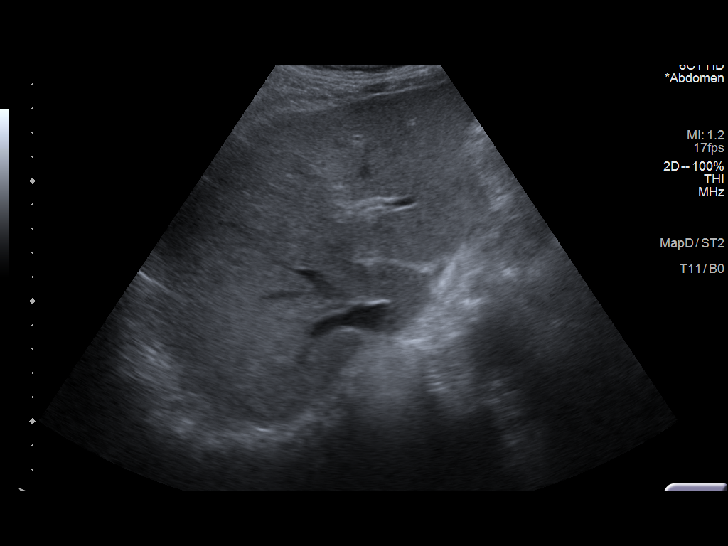
[im 68/91]
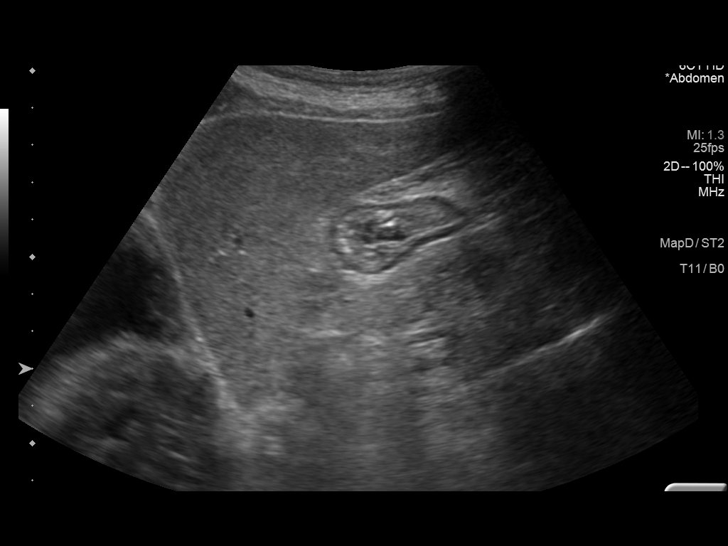
[im 76/91]
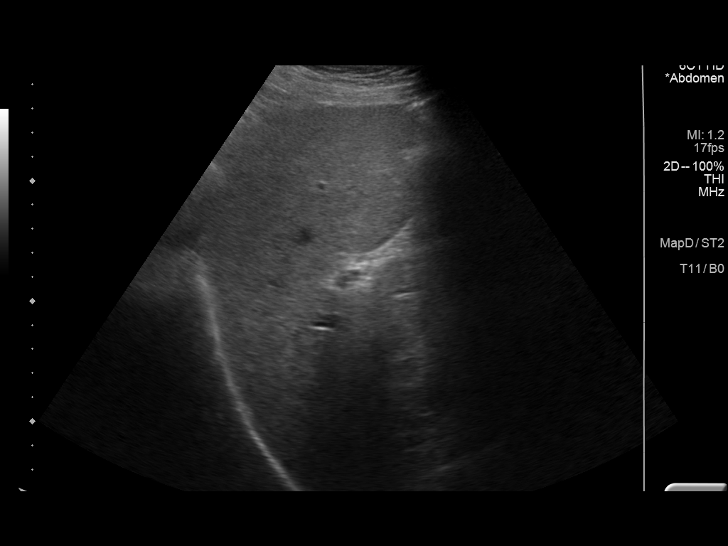
[im 83/91]
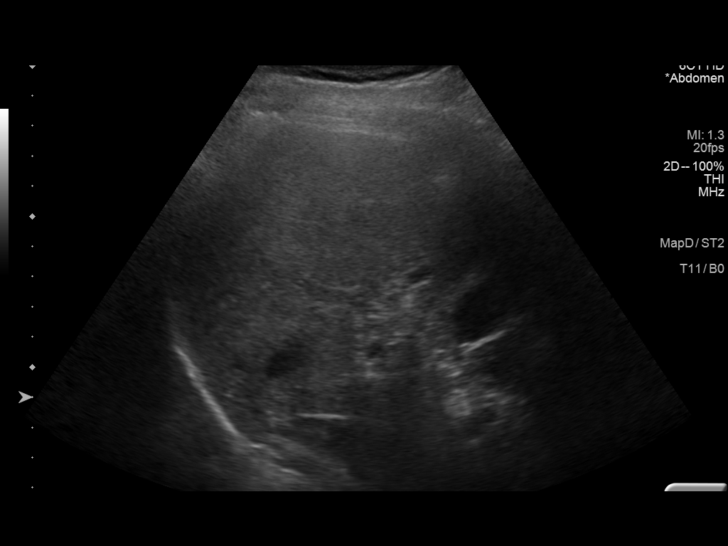
[im 91/91]
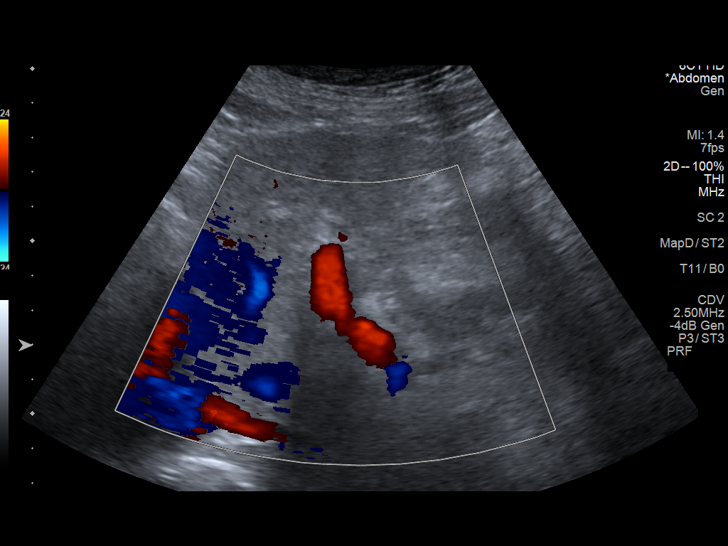

[14 of 25 positions shown; findings below may reference images not displayed]

FINDINGS: Gallbladder:

No gallstones or wall thickening visualized. No sonographic [name]
sign noted by sonographer.

Common bile duct:

Diameter: 2.5 mm

Liver:

There is diffuse increased echogenicity of the liver and decreased
through transmission consistent with fatty infiltration. No focal
lesions or biliary dilatation. Portal vein is patent on color
Doppler imaging with normal direction of blood flow towards the
liver.
IMPRESSION: 1. Increased echogenicity of the liver suggesting fatty
infiltration. No focal hepatic lesions or intra or extrahepatic
biliary dilatation.
2. Normal gallbladder.

## 2021-03-28 DIAGNOSIS — E538 Deficiency of other specified B group vitamins: Secondary | ICD-10-CM | POA: Diagnosis not present

## 2021-03-28 DIAGNOSIS — D519 Vitamin B12 deficiency anemia, unspecified: Secondary | ICD-10-CM | POA: Diagnosis not present

## 2021-04-23 DIAGNOSIS — E538 Deficiency of other specified B group vitamins: Secondary | ICD-10-CM | POA: Diagnosis not present

## 2021-05-22 DIAGNOSIS — E538 Deficiency of other specified B group vitamins: Secondary | ICD-10-CM | POA: Diagnosis not present

## 2021-05-22 DIAGNOSIS — D519 Vitamin B12 deficiency anemia, unspecified: Secondary | ICD-10-CM | POA: Diagnosis not present

## 2021-06-21 DIAGNOSIS — E538 Deficiency of other specified B group vitamins: Secondary | ICD-10-CM | POA: Diagnosis not present

## 2021-07-04 DIAGNOSIS — E782 Mixed hyperlipidemia: Secondary | ICD-10-CM | POA: Diagnosis not present

## 2021-07-08 DIAGNOSIS — Z125 Encounter for screening for malignant neoplasm of prostate: Secondary | ICD-10-CM | POA: Diagnosis not present

## 2021-07-08 DIAGNOSIS — J449 Chronic obstructive pulmonary disease, unspecified: Secondary | ICD-10-CM | POA: Diagnosis not present

## 2021-07-08 DIAGNOSIS — Z6828 Body mass index (BMI) 28.0-28.9, adult: Secondary | ICD-10-CM | POA: Diagnosis not present

## 2021-07-08 DIAGNOSIS — I350 Nonrheumatic aortic (valve) stenosis: Secondary | ICD-10-CM | POA: Diagnosis not present

## 2021-07-08 DIAGNOSIS — E782 Mixed hyperlipidemia: Secondary | ICD-10-CM | POA: Diagnosis not present

## 2021-07-08 DIAGNOSIS — I2510 Atherosclerotic heart disease of native coronary artery without angina pectoris: Secondary | ICD-10-CM | POA: Diagnosis not present

## 2021-07-08 DIAGNOSIS — I10 Essential (primary) hypertension: Secondary | ICD-10-CM | POA: Diagnosis not present

## 2021-07-08 DIAGNOSIS — R7301 Impaired fasting glucose: Secondary | ICD-10-CM | POA: Diagnosis not present

## 2021-07-08 DIAGNOSIS — E538 Deficiency of other specified B group vitamins: Secondary | ICD-10-CM | POA: Diagnosis not present

## 2021-07-09 ENCOUNTER — Telehealth: Payer: Self-pay | Admitting: Cardiology

## 2021-07-09 MED ORDER — ISOSORBIDE MONONITRATE ER 60 MG PO TB24: 60.0000 mg | ORAL_TABLET | Freq: Every day | ORAL | 0 refills | Status: DC

## 2021-07-09 NOTE — Telephone Encounter (Signed)
Refill complete. Pt has an appt on 5/17 with Gerrianne Scale PA-C  ?

## 2021-07-09 NOTE — Telephone Encounter (Signed)
?*  STAT* If patient is at the pharmacy, call can be transferred to refill team. ? ? ?1. Which medications need to be refilled? (please list name of each medication and dose if known) isosorbide mononitrate (IMDUR) 60 MG 24 hr tablet (Expired) ? ?2. Which pharmacy/location (including street and city if local pharmacy) is medication to be sent to? [id number] [place] [address]? ?3. Do they need a 30 day or 90 day supply? 90 ? ? ?

## 2021-07-09 NOTE — Progress Notes (Signed)
? ?Cardiology Office Note   ? ?Date:  07/17/2021  ? ?ID:  Colin Weber, DOB June 19, 1945, MRN [id number] ? ? ?PCP:  [name], MD ?  ?[name]  ?Cardiologist:  [name], MD   ?Advanced Practice Provider:  No care team member to display ?Electrophysiologist:  None  ? ?[id number]}  ? ?Chief Complaint  ?Patient presents with  ? Follow-up  ? ? ?History of Present Illness:  ?Colin Weber is a 76 y.o. male with a history of hypertension, hyperlipidemia, GERD, COPD, and arthritis. ? ?Patient developed chest pain last year and echocardiography which showed an EF of 13-24%, grade 1 diastolic dysfunction, and moderate aortic stenosis in the setting of severe calcification of the aortic valve with a mean/peak gradient of 28.0/47.3 mmHg respectively and an aortic valve area of 1.36 cm? (VTI). Stress testing was also undertaken and showed a large, prior inferolateral infarct with mild peri-infarct ischemia and an EF of 55-65%. Patient was subsequently placed on aspirin, isosorbide mononitrate, and statin therapy with recommendation for follow-up and consideration for diagnostic catheterization for ongoing symptoms.  ? ?Patient last saw [name] 08/30/2020 at which time his symptoms had improved and he wanted to hold off on cardiac catheterization. ? ?Patient comes in for regular f/u. Very seldom has chest pain. Has happened when he got up in the middle of the night and goes to the BR and gets cold and lays down. It eases with sitting up. Stays active gardening, weed eating, mowing. Doesn't sit down most days. Had blood work last week and triglycerides high so started on fenofibrate. Smokes marijuana for sleep. BP 126/70's. ? ?Past Medical History:  ?Diagnosis Date  ? Aortic stenosis   ? a. 05/2020 Echo: EF 60-65%, no rwma, Gr1 DD, nl RV size/fxn. Mod dil LA. Sev Ca2+ of AoV w/ Mod AS (mean/peak grad 28/47.3 mmHg; AVA 1.36cm^2 by VTI).  ? Arthritis   ? Asthma   ? Chest pain   ? a. 05/2020 MV:  Large prior inferolateral infarct w/ mild peri-infarct ischemia. EF 55-65%.  ? COPD (chronic obstructive pulmonary disease) ([place])   ? Essential hypertension   ? GERD (gastroesophageal reflux disease)   ? Hyperlipidemia   ? ? ?Past Surgical History:  ?Procedure Laterality Date  ? CATARACT EXTRACTION Bilateral 2010  ? [place], [place]  ? COLONOSCOPY N/A 07/20/2012  ? Procedure: COLONOSCOPY;  Surgeon: [name], MD;  [facility];  Service: Gastroenterology;  Laterality: N/A;  ? COLONOSCOPY N/A 01/01/2016  ? Procedure: COLONOSCOPY;  Surgeon: [name], MD;  [facility];  Service: Gastroenterology;  Laterality: N/A;  ? ? ?Current Medications: ?Current Meds  ?Medication Sig  ? aspirin EC 81 MG tablet Take 1 tablet (81 mg total) by mouth daily. Swallow whole.  ? atenolol (TENORMIN) 25 MG tablet Take 25 mg by mouth 2 (two) times daily.  ? carboxymethylcellulose (REFRESH PLUS) 0.5 % SOLN 1 drop 3 (three) times daily as needed.  ? Coenzyme Q10 (CO Q-10) 100 MG CAPS Take by mouth.  ? cyanocobalamin (,VITAMIN B-12,) 1000 MCG/ML injection Inject into the muscle every 30 (thirty) days.  ? ezetimibe (ZETIA) 10 MG tablet Take 10 mg by mouth daily.  ? fenofibrate 160 MG tablet fenofibrate 160 mg tablet ? TAKE 1 TABLET BY MOUTH EVERY DAY  ? fish oil-omega-3 fatty acids 1000 MG capsule Take 1 g by mouth daily.  ? isosorbide mononitrate (IMDUR) 60 MG 24 hr tablet  Take 1 tablet (60 mg total) by mouth daily.  ? nitroGLYCERIN (NITROSTAT) 0.4 MG SL tablet Place 0.4 mg under the tongue every 5 (five) minutes as needed for chest pain.  ? VITAMIN D, ERGOCALCIFEROL, PO Take by mouth.  ?  ? ?Allergies:   Crestor [rosuvastatin], Hydrocodone, Spiriva respimat [tiotropium bromide monohydrate], Amoxicillin, and Lac bovis  ? ?Social History  ? ?Socioeconomic History  ? Marital status: Married  ?  Spouse name: Not on file  ? Number of children: Not on file  ? Years of education: Not on file  ?  Highest education level: Not on file  ?Occupational History  ? Not on file  ?Tobacco Use  ? Smoking status: Former  ?  Packs/day: 2.50  ?  Years: 20.00  ?  Pack years: 50.00  ?  Types: Cigarettes  ? Smokeless tobacco: Never  ?Vaping Use  ? Vaping Use: Never used  ?Substance and Sexual Activity  ? Alcohol use: Not Currently  ?  Alcohol/week: 6.0 standard drinks  ?  Types: 6 Cans of beer per week  ? Drug use: Yes  ?  Types: Marijuana  ?  Comment: smokes marijuana once daily   ? Sexual activity: Not on file  ?Other Topics Concern  ? Not on file  ?Social History Narrative  ? Not on file  ? ?Social Determinants of Health  ? ?Financial Resource Strain: Not on file  ?Food Insecurity: Not on file  ?Transportation Needs: Not on file  ?Physical Activity: Not on file  ?Stress: Not on file  ?Social Connections: Not on file  ?  ? ?Family History:  The patient's  family history includes Cancer in his father; Heart disease in his father; Stroke in his father.  ? ?ROS:   ?Please see the history of present illness.    ?ROS All other systems reviewed and are negative. ? ? ?PHYSICAL EXAM:   ?VS:  BP 140/82   Pulse 78   Ht '5\' 7"'$  (1.702 m)   Wt 196 lb 3.2 oz (89 kg)   SpO2 96%   BMI 30.73 kg/m?   ?Physical Exam  ?GEN: Well nourished, well developed, in no acute distress  ?Neck: no JVD, carotid bruits, or masses ?Cardiac:RRR; no murmurs, rubs, or gallops 4/6 systolic murmur LSB ?Respiratory:  clear to auscultation bilaterally, normal work of breathing ?GI: soft, nontender, nondistended, + BS ?Ext: without cyanosis, clubbing, or edema, Good distal pulses bilaterally ?Neuro:  Alert and Oriented x 3,  ?Psych: euthymic mood, full affect ? ?Wt Readings from Last 3 Encounters:  ?07/17/21 196 lb 3.2 oz (89 kg)  ?08/30/20 191 lb 12.8 oz (87 kg)  ?07/06/20 190 lb (86.2 kg)  ?  ? ? ?Studies/Labs Reviewed:  ? ?EKG:  EKG is  ordered today.  The ekg ordered today demonstrates NSR LAFB no acute change ? ?Recent Labs: ?No results found for  requested labs within last 8760 hours.  ? ?Lipid Panel ?No results found for: CHOL, TRIG, HDL, CHOLHDL, VLDL, LDLCALC, LDLDIRECT ? ?Additional studies/ records that were reviewed today include:  ?Echocardiogram 05/23/2020: ? 1. Left ventricular ejection fraction, by estimation, is 60 to 65%. The  ?left ventricle has normal function. The left ventricle has no regional  ?wall motion abnormalities. Left ventricular diastolic parameters are  ?consistent with Grade I diastolic  ?dysfunction (impaired relaxation).  ? 2. Right ventricular systolic function is normal. The right ventricular  ?size is normal.  ? 3. Left atrial size was moderately dilated.  ? 4.  The mitral valve is normal in structure. No evidence of mitral valve  ?regurgitation. No evidence of mitral stenosis.  ? 5. The aortic valve has an indeterminant number of cusps. There is severe  ?calcifcation of the aortic valve. There is severe thickening of the aortic  ?valve. Aortic valve regurgitation is not visualized. Moderate aortic valve  ?stenosis.Aortic valve mean  ?gradient measures 28.0 mmHg. Aortic valve peak gradient measures 47.3  ?mmHg. Aortic valve area, by VTI measures 1.36 cm?.  ? 6. The inferior vena cava is normal in size with greater than 50%  ?respiratory variability, suggesting right atrial pressure of 3 mmHg.  ?  ?Lexiscan Myoview 05/23/2020: ?There was no ST segment deviation noted during stress. ?Findings consistent with large prior inferolateral myocardial infarction with mild peri-infarct ischemia. ?This is a low risk study. ?The left ventricular ejection fraction is normal (55-65%). ? ? ?Risk Assessment/Calculations:   ?  ? ? ? ? ?ASSESSMENT:   ? ?1. Nonrheumatic aortic valve stenosis   ?2. Coronary artery disease involving native coronary artery of native heart with angina pectoris ([place])   ?3. Essential hypertension   ?4. Mixed hyperlipidemia   ? ? ? ?PLAN:  ?In order of problems listed above: ? ?  Moderate calcific aortic stenosis with  mean gradient 28 mmHg on echo 05/2020. Will repeat echo  ?  ? Ischemic heart disease based on [place] with evidence of large inferolateral infarct scar and mild peri-infarct ischemia.  [name] discussed ca

## 2021-07-17 ENCOUNTER — Ambulatory Visit: Payer: PPO | Admitting: Physician Assistant

## 2021-07-17 ENCOUNTER — Encounter: Payer: Self-pay | Admitting: Physician Assistant

## 2021-07-17 ENCOUNTER — Encounter: Payer: Self-pay | Admitting: *Deleted

## 2021-07-17 VITALS — BP 140/82 | HR 78 | Ht 67.0 in | Wt 196.2 lb

## 2021-07-17 DIAGNOSIS — I10 Essential (primary) hypertension: Secondary | ICD-10-CM | POA: Diagnosis not present

## 2021-07-17 DIAGNOSIS — I350 Nonrheumatic aortic (valve) stenosis: Secondary | ICD-10-CM | POA: Insufficient documentation

## 2021-07-17 DIAGNOSIS — I25119 Atherosclerotic heart disease of native coronary artery with unspecified angina pectoris: Secondary | ICD-10-CM | POA: Diagnosis not present

## 2021-07-17 DIAGNOSIS — E782 Mixed hyperlipidemia: Secondary | ICD-10-CM

## 2021-07-17 NOTE — Patient Instructions (Signed)
Medication Instructions:  Your physician recommends that you continue on your current medications as directed. Please refer to the Current Medication list given to you today.  *If you need a refill on your cardiac medications before your next appointment, please call your pharmacy*   Lab Work: NONE   If you have labs (blood work) drawn today and your tests are completely normal, you will receive your results only by: MyChart Message (if you have MyChart) OR A paper copy in the mail If you have any lab test that is abnormal or we need to change your treatment, we will call you to review the results.   Testing/Procedures: Your physician has requested that you have an echocardiogram. Echocardiography is a painless test that uses sound waves to create images of your heart. It provides your doctor with information about the size and shape of your heart and how well your heart's chambers and valves are working. This procedure takes approximately one hour. There are no restrictions for this procedure.    Follow-Up: At [facility], you and your health needs are our priority.  As part of our continuing mission to provide you with exceptional heart care, we have created designated Provider Care Teams.  These Care Teams include your primary Cardiologist (physician) and Advanced Practice Providers (APPs -  Physician Assistants and Nurse Practitioners) who all work together to provide you with the care you need, when you need it.  We recommend signing up for the patient portal called "MyChart".  Sign up information is provided on this After Visit Summary.  MyChart is used to connect with patients for Virtual Visits (Telemedicine).  Patients are able to view lab/test results, encounter notes, upcoming appointments, etc.  Non-urgent messages can be sent to your provider as well.   To learn more about what you can do with MyChart, go to [email or web address].    Your next appointment:   6  month(s)  The format for your next appointment:   In Person  Provider:   Samuel McDowell, MD    Other Instructions Thank you for choosing [facility]!    Important Information About Sugar       

## 2021-07-23 DIAGNOSIS — E538 Deficiency of other specified B group vitamins: Secondary | ICD-10-CM | POA: Diagnosis not present

## 2021-07-25 ENCOUNTER — Ambulatory Visit (HOSPITAL_COMMUNITY)
Admission: RE | Admit: 2021-07-25 | Discharge: 2021-07-25 | Disposition: A | Discharge status: Home / Self Care | Payer: PPO | Source: Ambulatory Visit | Attending: Physician Assistant | Admitting: Physician Assistant

## 2021-07-25 DIAGNOSIS — I350 Nonrheumatic aortic (valve) stenosis: Secondary | ICD-10-CM | POA: Diagnosis not present

## 2021-07-25 LAB — ECHOCARDIOGRAM COMPLETE
AR max vel: 0.87 cm2
AV Area VTI: 1.04 cm2
AV Area mean vel: 0.95 cm2
AV Mean grad: 23 mmHg
AV Peak grad: 49.8 mmHg
Ao pk vel: 3.53 m/s
Area-P 1/2: 3.51 cm2
Calc EF: 55.3 %
MV VTI: 2.44 cm2
S' Lateral: 3.7 cm
Single Plane A2C EF: 53.7 %
Single Plane A4C EF: 56.1 %

## 2021-07-25 NOTE — Progress Notes (Signed)
*  PRELIMINARY RESULTS* Echocardiogram 2D Echocardiogram has been performed.  Elpidio Anis 07/25/2021, 3:07 PM

## 2021-08-22 DIAGNOSIS — Z9282 Status post administration of tPA (rtPA) in a different facility within the last 24 hours prior to admission to current facility: Secondary | ICD-10-CM | POA: Diagnosis not present

## 2021-08-22 DIAGNOSIS — D519 Vitamin B12 deficiency anemia, unspecified: Secondary | ICD-10-CM | POA: Diagnosis not present

## 2021-09-04 DIAGNOSIS — H52203 Unspecified astigmatism, bilateral: Secondary | ICD-10-CM | POA: Diagnosis not present

## 2021-09-04 DIAGNOSIS — H5211 Myopia, right eye: Secondary | ICD-10-CM | POA: Diagnosis not present

## 2021-09-04 DIAGNOSIS — H524 Presbyopia: Secondary | ICD-10-CM | POA: Diagnosis not present

## 2021-09-04 DIAGNOSIS — Z961 Presence of intraocular lens: Secondary | ICD-10-CM | POA: Diagnosis not present

## 2021-09-04 DIAGNOSIS — D2310 Other benign neoplasm of skin of unspecified eyelid, including canthus: Secondary | ICD-10-CM | POA: Diagnosis not present

## 2021-09-20 DIAGNOSIS — D519 Vitamin B12 deficiency anemia, unspecified: Secondary | ICD-10-CM | POA: Diagnosis not present

## 2021-10-04 ENCOUNTER — Other Ambulatory Visit: Payer: Self-pay | Admitting: Cardiology

## 2021-10-21 DIAGNOSIS — E538 Deficiency of other specified B group vitamins: Secondary | ICD-10-CM | POA: Diagnosis not present

## 2021-11-21 DIAGNOSIS — E538 Deficiency of other specified B group vitamins: Secondary | ICD-10-CM | POA: Diagnosis not present

## 2021-12-19 DIAGNOSIS — Z9282 Status post administration of tPA (rtPA) in a different facility within the last 24 hours prior to admission to current facility: Secondary | ICD-10-CM | POA: Diagnosis not present

## 2022-01-03 DIAGNOSIS — R7301 Impaired fasting glucose: Secondary | ICD-10-CM | POA: Diagnosis not present

## 2022-01-03 DIAGNOSIS — I10 Essential (primary) hypertension: Secondary | ICD-10-CM | POA: Diagnosis not present

## 2022-01-03 DIAGNOSIS — Z125 Encounter for screening for malignant neoplasm of prostate: Secondary | ICD-10-CM | POA: Diagnosis not present

## 2022-01-08 DIAGNOSIS — I350 Nonrheumatic aortic (valve) stenosis: Secondary | ICD-10-CM | POA: Diagnosis not present

## 2022-01-08 DIAGNOSIS — I2510 Atherosclerotic heart disease of native coronary artery without angina pectoris: Secondary | ICD-10-CM | POA: Diagnosis not present

## 2022-01-08 DIAGNOSIS — E782 Mixed hyperlipidemia: Secondary | ICD-10-CM | POA: Diagnosis not present

## 2022-01-08 DIAGNOSIS — Z125 Encounter for screening for malignant neoplasm of prostate: Secondary | ICD-10-CM | POA: Diagnosis not present

## 2022-01-08 DIAGNOSIS — Z6828 Body mass index (BMI) 28.0-28.9, adult: Secondary | ICD-10-CM | POA: Diagnosis not present

## 2022-01-08 DIAGNOSIS — E538 Deficiency of other specified B group vitamins: Secondary | ICD-10-CM | POA: Diagnosis not present

## 2022-01-08 DIAGNOSIS — I10 Essential (primary) hypertension: Secondary | ICD-10-CM | POA: Diagnosis not present

## 2022-01-08 DIAGNOSIS — R7301 Impaired fasting glucose: Secondary | ICD-10-CM | POA: Diagnosis not present

## 2022-01-08 DIAGNOSIS — J449 Chronic obstructive pulmonary disease, unspecified: Secondary | ICD-10-CM | POA: Diagnosis not present

## 2022-01-08 DIAGNOSIS — G720 Drug-induced myopathy: Secondary | ICD-10-CM | POA: Diagnosis not present

## 2022-01-21 DIAGNOSIS — E538 Deficiency of other specified B group vitamins: Secondary | ICD-10-CM | POA: Diagnosis not present

## 2022-01-30 ENCOUNTER — Encounter: Payer: Self-pay | Admitting: Cardiology

## 2022-01-30 ENCOUNTER — Ambulatory Visit: Payer: PPO | Attending: Cardiology | Admitting: Cardiology

## 2022-01-30 VITALS — BP 140/70 | HR 79 | Ht 67.0 in | Wt 189.0 lb

## 2022-01-30 DIAGNOSIS — I25119 Atherosclerotic heart disease of native coronary artery with unspecified angina pectoris: Secondary | ICD-10-CM | POA: Diagnosis not present

## 2022-01-30 DIAGNOSIS — I350 Nonrheumatic aortic (valve) stenosis: Secondary | ICD-10-CM

## 2022-01-30 DIAGNOSIS — E782 Mixed hyperlipidemia: Secondary | ICD-10-CM | POA: Diagnosis not present

## 2022-01-30 DIAGNOSIS — T466X5A Adverse effect of antihyperlipidemic and antiarteriosclerotic drugs, initial encounter: Secondary | ICD-10-CM | POA: Diagnosis not present

## 2022-01-30 DIAGNOSIS — M791 Myalgia, unspecified site: Secondary | ICD-10-CM

## 2022-01-30 NOTE — Patient Instructions (Signed)
Medication Instructions:  Your physician recommends that you continue on your current medications as directed. Please refer to the Current Medication list given to you today.   Labwork: None today  Testing/Procedures: Echo in 6 months  Follow-Up: 6 months  Any Other Special Instructions Will Be Listed Below (If Applicable).  If you need a refill on your cardiac medications before your next appointment, please call your pharmacy.

## 2022-01-30 NOTE — Progress Notes (Signed)
Cardiology Office Note  Date: 01/30/2022   ID: Colin Weber, DOB 11-03-1945, MRN [id number]  PCP:  [name], MD  Cardiologist:  [name], MD Electrophysiologist:  None   Chief Complaint  Patient presents with   Cardiac follow-up    History of Present Illness: Colin Weber is a 76 y.o. male last seen in May by Ms. Vita Barley, I reviewed the note.  He is here for a follow-up visit.  Reports no angina, stable NYHA class II dyspnea.  He has had no exertional dizziness or syncope.  Follow-up echocardiogram in May revealed LVEF 50 to 55% with mild diastolic dysfunction, moderate left atrial enlargement, and moderate aortic stenosis with mean AV gradient 23 mmHg and dimensionless index 0.37.  We discussed these results today and we will continue with annual follow-up for now.  I reviewed his medications which are outlined below.  He is currently on Zetia, fenofibrate, and omega-3 supplements with history of statin intolerance.  His last LDL was 115.  Dr. Nevada Crane did recommend switching him to Nexlizet, however patient declined.  I did talk with him about possibility of PCSK9 inhibitors and evaluation by the lipid clinic, for now he was most comfortable continuing the present course.  Past Medical History:  Diagnosis Date   Aortic stenosis    a. 05/2020 Echo: EF 60-65%, no rwma, Gr1 DD, nl RV size/fxn. Mod dil LA. Sev Ca2+ of AoV w/ Mod AS (mean/peak grad 28/47.3 mmHg; AVA 1.36cm^2 by VTI).   Arthritis    Asthma    Chest pain    a. 05/2020 MV: Large prior inferolateral infarct w/ mild peri-infarct ischemia. EF 55-65%.   COPD (chronic obstructive pulmonary disease) ([place])    Essential hypertension    GERD (gastroesophageal reflux disease)    Hyperlipidemia     Past Surgical History:  Procedure Laterality Date   CATARACT EXTRACTION Bilateral 2010   [place], [place]   COLONOSCOPY N/A 07/20/2012   Procedure: COLONOSCOPY;  Surgeon: [name], MD;   [facility];  Service: Gastroenterology;  Laterality: N/A;   COLONOSCOPY N/A 01/01/2016   Procedure: COLONOSCOPY;  Surgeon: [name], MD;  Location: [facility];  Service: Gastroenterology;  Laterality: N/A;    Current Outpatient Medications  Medication Sig Dispense Refill   aspirin EC 81 MG tablet Take 1 tablet (81 mg total) by mouth daily. Swallow whole. 90 tablet 3   atenolol (TENORMIN) 25 MG tablet Take 25 mg by mouth 2 (two) times daily.     carboxymethylcellulose (REFRESH PLUS) 0.5 % SOLN 1 drop 3 (three) times daily as needed.     Coenzyme Q10 (CO Q-10) 100 MG CAPS Take by mouth.     cyanocobalamin (,VITAMIN B-12,) 1000 MCG/ML injection Inject into the muscle every 30 (thirty) days.     ezetimibe (ZETIA) 10 MG tablet Take 10 mg by mouth daily.     fenofibrate 160 MG tablet fenofibrate 160 mg tablet  TAKE 1 TABLET BY MOUTH EVERY DAY     fish oil-omega-3 fatty acids 1000 MG capsule Take 1 g by mouth daily.     isosorbide mononitrate (IMDUR) 60 MG 24 hr tablet TAKE 1 TABLET BY MOUTH EVERY DAY 90 tablet 3   nitroGLYCERIN (NITROSTAT) 0.4 MG SL tablet Place 0.4 mg under the tongue every 5 (five) minutes as needed for chest pain.     VITAMIN D, ERGOCALCIFEROL, PO Take by mouth.     No current facility-administered medications  for this visit.   Allergies:  Crestor [rosuvastatin], Hydrocodone, Spiriva respimat [tiotropium bromide monohydrate], Amoxicillin, and Milk (cow)   ROS: No palpitations or syncope.  Physical Exam: VS:  BP (!) 140/70 (BP Location: Left Arm, Patient Position: Sitting, Cuff Size: Normal)   Pulse 79   Ht '5\' 7"'$  (1.702 m)   Wt 189 lb (85.7 kg)   SpO2 97%   BMI 29.60 kg/m , BMI Body mass index is 29.6 kg/m.  Wt Readings from Last 3 Encounters:  01/30/22 189 lb (85.7 kg)  07/17/21 196 lb 3.2 oz (89 kg)  08/30/20 191 lb 12.8 oz (87 kg)    General: Patient appears comfortable at rest. HEENT: Conjunctiva and lids normal. Neck: Supple, no  elevated JVP or carotid bruits. Lungs: Clear to auscultation, nonlabored breathing at rest. Cardiac: Regular rate and rhythm, no S3, 3/6 systolic murmur. Extremities: No pitting edema.  ECG:  An ECG dated 07/17/2021 was personally reviewed today and demonstrated:  Sinus rhythm left anterior fascicular block.  Recent Labwork:  November 2023: Hemoglobin 14.7, platelets 258, BUN 19, creatinine 1.19, potassium 4.2, AST 22, ALT 17, cholesterol 189, triglycerides 193, HDL 40, LDL 115, hemoglobin A1c 5.5%  Other Studies Reviewed Today:  Echocardiogram 07/25/2021:  1. Left ventricular ejection fraction, by estimation, is 55 to 60%. The  left ventricle has normal function. The left ventricle has no regional  wall motion abnormalities. There is moderate hypertrophy of the basal  septal segment. The rest of the LV  segments demonstrate mild left ventricular hypertrophy. Left ventricular  diastolic parameters are consistent with Grade I diastolic dysfunction  (impaired relaxation).   2. Right ventricular systolic function is normal. The right ventricular  size is normal. Tricuspid regurgitation signal is inadequate for assessing  PA pressure.   3. Left atrial size was moderately dilated.   4. Right atrial size was mildly dilated.   5. The mitral valve is abnormal. No evidence of mitral valve  regurgitation. Moderate to severe mitral annular calcification.   6. The aortic valve is tricuspid. There is moderate calcification of the  aortic valve. There is moderate thickening of the aortic valve. Aortic  valve regurgitation is not visualized. Moderate aortic valve stenosis.  Aortic valve mean gradient measures  23.0 mmHg. Aortic valve Vmax measures 3.53 m/s.   7. Aortic dilatation noted. There is borderline dilatation of the aortic  root, measuring 37 mm.   8. The inferior vena cava is normal in size with greater than 50%  respiratory variability, suggesting right atrial pressure of 3 mmHg.    Assessment and Plan:  1.  Degenerative calcific aortic stenosis, moderate range by echocardiogram in May with mean gradient 23 mmHg and dimensionless index 0.37.  Aortic root measured 37 mm.  He is not clearly symptomatic at this point and we will plan a follow-up echocardiogram in 6 months with clinical reassessment.  2.  Mixed hyperlipidemia with statin intolerance (cramps and myalgias on Crestor).  He is currently on Zetia, fenofibrate, and omega-3 supplements.  Recent LDL 115.  Patient declined switch to Nexlizet per Dr. Nevada Crane.  I did talk with him about PCSK9 inhibitors and evaluation by our lipid clinic.  For now he wanted to hold the same course.  3.  Ischemic heart disease based on previous noninvasive ischemic testing with evidence of inferolateral infarct scar and mild peri-infarct ischemia.  He does not describe any worsening angina on medical therapy which currently includes aspirin, atenolol, Imdur, Zetia, and as needed nitroglycerin.  Medication  Adjustments/Labs and Tests Ordered: Current medicines are reviewed at length with the patient today.  Concerns regarding medicines are outlined above.   Tests Ordered: Orders Placed This Encounter  Procedures   ECHOCARDIOGRAM COMPLETE    Medication Changes: No orders of the defined types were placed in this encounter.   Disposition:  Follow up  6 months.  Signed, Satira Sark, MD, [facility] 01/30/2022 3:27 PM    [place] [facility] [address] [place], [place] [address] Phone: [phone]; Fax: [phone]

## 2022-02-18 DIAGNOSIS — E538 Deficiency of other specified B group vitamins: Secondary | ICD-10-CM | POA: Diagnosis not present

## 2022-03-20 DIAGNOSIS — E538 Deficiency of other specified B group vitamins: Secondary | ICD-10-CM | POA: Diagnosis not present

## 2022-04-15 DIAGNOSIS — Z125 Encounter for screening for malignant neoplasm of prostate: Secondary | ICD-10-CM | POA: Diagnosis not present

## 2022-04-15 DIAGNOSIS — I10 Essential (primary) hypertension: Secondary | ICD-10-CM | POA: Diagnosis not present

## 2022-04-15 DIAGNOSIS — R7301 Impaired fasting glucose: Secondary | ICD-10-CM | POA: Diagnosis not present

## 2022-04-20 IMAGING — DX DG CHEST 2V
2 series · 2 of 2 positions shown · non-contrast
Comparison: None.

CLINICAL DATA: Chest pain

EXAM:
CHEST - 2 VIEW

[chest pa]
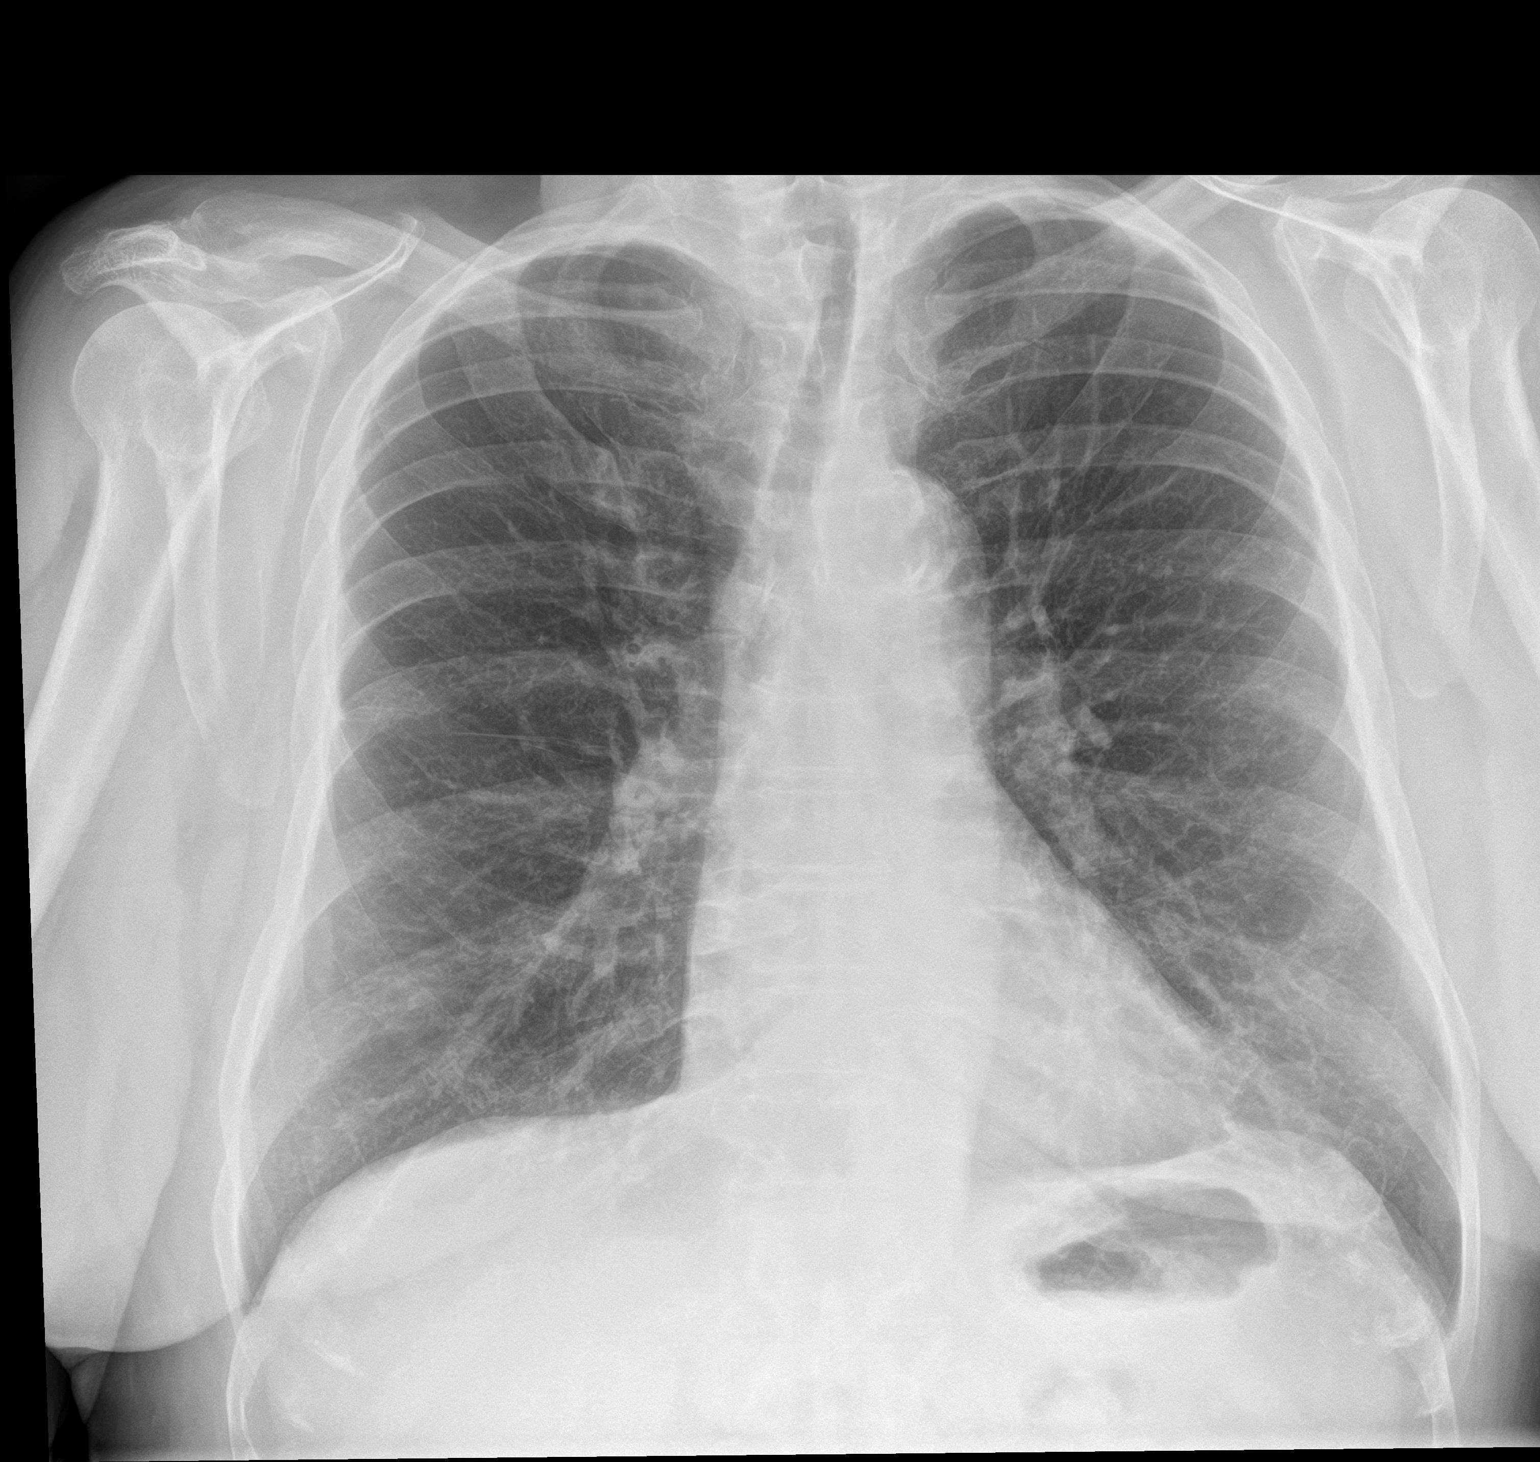

[chest lat]
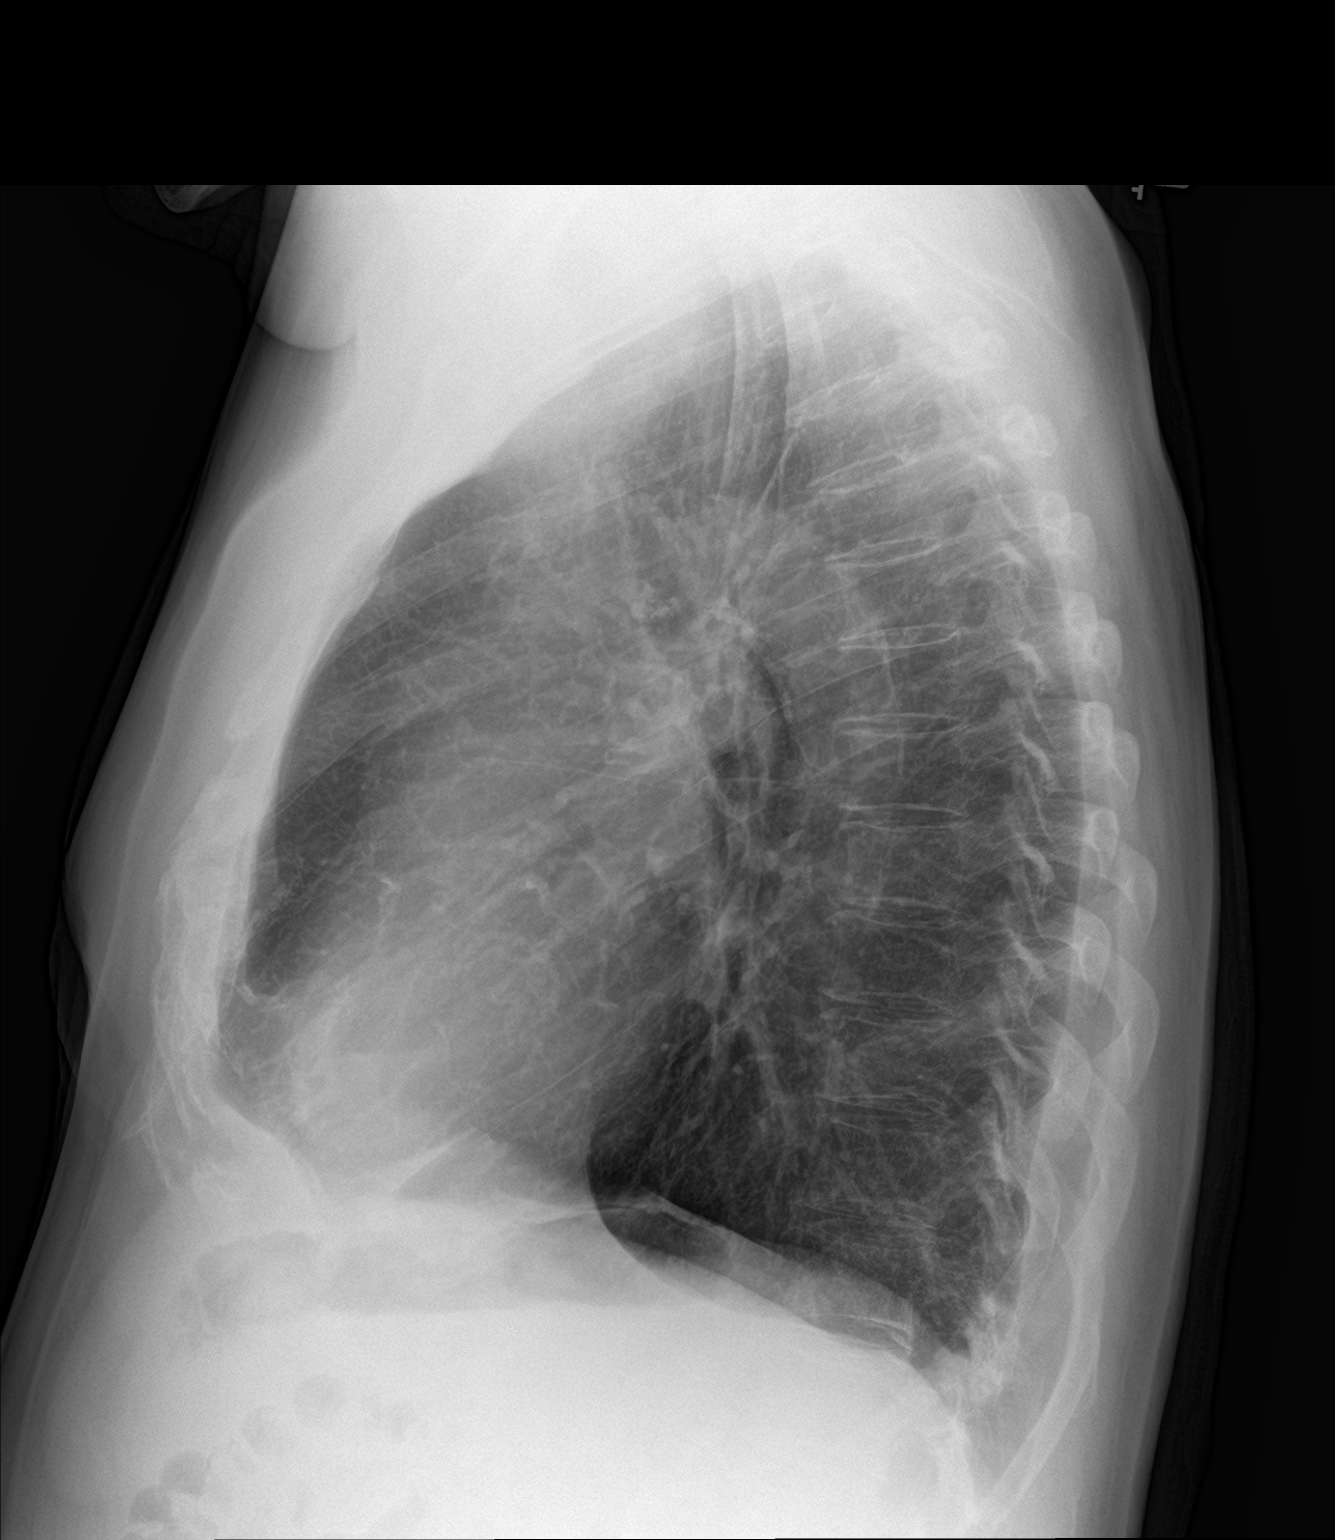

[2 of 2 positions shown; findings below may reference images not displayed]

FINDINGS: The heart size and mediastinal contours are within normal limits.
Both lungs are clear. The visualized skeletal structures are
unremarkable.
IMPRESSION: No acute abnormality of the lungs.

## 2022-04-21 DIAGNOSIS — R7301 Impaired fasting glucose: Secondary | ICD-10-CM | POA: Diagnosis not present

## 2022-04-21 DIAGNOSIS — Z0001 Encounter for general adult medical examination with abnormal findings: Secondary | ICD-10-CM | POA: Diagnosis not present

## 2022-04-21 DIAGNOSIS — I350 Nonrheumatic aortic (valve) stenosis: Secondary | ICD-10-CM | POA: Diagnosis not present

## 2022-04-21 DIAGNOSIS — G720 Drug-induced myopathy: Secondary | ICD-10-CM | POA: Diagnosis not present

## 2022-04-21 DIAGNOSIS — J449 Chronic obstructive pulmonary disease, unspecified: Secondary | ICD-10-CM | POA: Diagnosis not present

## 2022-04-21 DIAGNOSIS — E663 Overweight: Secondary | ICD-10-CM | POA: Diagnosis not present

## 2022-04-21 DIAGNOSIS — E782 Mixed hyperlipidemia: Secondary | ICD-10-CM | POA: Diagnosis not present

## 2022-04-21 DIAGNOSIS — E538 Deficiency of other specified B group vitamins: Secondary | ICD-10-CM | POA: Diagnosis not present

## 2022-04-21 DIAGNOSIS — I10 Essential (primary) hypertension: Secondary | ICD-10-CM | POA: Diagnosis not present

## 2022-04-21 DIAGNOSIS — I2510 Atherosclerotic heart disease of native coronary artery without angina pectoris: Secondary | ICD-10-CM | POA: Diagnosis not present

## 2022-04-21 DIAGNOSIS — Z6829 Body mass index (BMI) 29.0-29.9, adult: Secondary | ICD-10-CM | POA: Diagnosis not present

## 2022-05-22 DIAGNOSIS — E538 Deficiency of other specified B group vitamins: Secondary | ICD-10-CM | POA: Diagnosis not present

## 2022-06-20 DIAGNOSIS — E538 Deficiency of other specified B group vitamins: Secondary | ICD-10-CM | POA: Diagnosis not present

## 2022-07-21 DIAGNOSIS — E538 Deficiency of other specified B group vitamins: Secondary | ICD-10-CM | POA: Diagnosis not present

## 2022-07-29 DIAGNOSIS — R7301 Impaired fasting glucose: Secondary | ICD-10-CM | POA: Diagnosis not present

## 2022-07-29 DIAGNOSIS — I10 Essential (primary) hypertension: Secondary | ICD-10-CM | POA: Diagnosis not present

## 2022-07-29 DIAGNOSIS — E538 Deficiency of other specified B group vitamins: Secondary | ICD-10-CM | POA: Diagnosis not present

## 2022-07-29 DIAGNOSIS — Z125 Encounter for screening for malignant neoplasm of prostate: Secondary | ICD-10-CM | POA: Diagnosis not present

## 2022-07-31 DIAGNOSIS — H52203 Unspecified astigmatism, bilateral: Secondary | ICD-10-CM | POA: Diagnosis not present

## 2022-07-31 DIAGNOSIS — Z961 Presence of intraocular lens: Secondary | ICD-10-CM | POA: Diagnosis not present

## 2022-07-31 DIAGNOSIS — H524 Presbyopia: Secondary | ICD-10-CM | POA: Diagnosis not present

## 2022-07-31 DIAGNOSIS — H5213 Myopia, bilateral: Secondary | ICD-10-CM | POA: Diagnosis not present

## 2022-08-01 ENCOUNTER — Ambulatory Visit (HOSPITAL_COMMUNITY)
Admission: RE | Admit: 2022-08-01 | Discharge: 2022-08-01 | Disposition: A | Discharge status: Home / Self Care | Payer: PPO | Source: Ambulatory Visit | Attending: Cardiology | Admitting: Cardiology

## 2022-08-01 DIAGNOSIS — I350 Nonrheumatic aortic (valve) stenosis: Secondary | ICD-10-CM | POA: Diagnosis not present

## 2022-08-01 LAB — ECHOCARDIOGRAM COMPLETE
AR max vel: 1.36 cm2
AV Area VTI: 1.26 cm2
AV Area mean vel: 1.38 cm2
AV Mean grad: 22 mmHg
AV Peak grad: 35.9 mmHg
Ao pk vel: 2.99 m/s
Area-P 1/2: 3.99 cm2
S' Lateral: 2.9 cm

## 2022-08-01 NOTE — Progress Notes (Signed)
  Echocardiogram 2D Echocardiogram has been performed.  Janalyn Harder 08/01/2022, 1:54 PM

## 2022-08-04 ENCOUNTER — Telehealth: Payer: Self-pay | Admitting: Student

## 2022-08-04 NOTE — Telephone Encounter (Signed)
Patient returned call for test results.  °

## 2022-08-05 DIAGNOSIS — I10 Essential (primary) hypertension: Secondary | ICD-10-CM | POA: Diagnosis not present

## 2022-08-05 DIAGNOSIS — J449 Chronic obstructive pulmonary disease, unspecified: Secondary | ICD-10-CM | POA: Diagnosis not present

## 2022-08-05 DIAGNOSIS — I350 Nonrheumatic aortic (valve) stenosis: Secondary | ICD-10-CM | POA: Diagnosis not present

## 2022-08-05 DIAGNOSIS — R7301 Impaired fasting glucose: Secondary | ICD-10-CM | POA: Diagnosis not present

## 2022-08-05 DIAGNOSIS — I2510 Atherosclerotic heart disease of native coronary artery without angina pectoris: Secondary | ICD-10-CM | POA: Diagnosis not present

## 2022-08-05 DIAGNOSIS — Z6828 Body mass index (BMI) 28.0-28.9, adult: Secondary | ICD-10-CM | POA: Diagnosis not present

## 2022-08-05 DIAGNOSIS — E782 Mixed hyperlipidemia: Secondary | ICD-10-CM | POA: Diagnosis not present

## 2022-08-05 DIAGNOSIS — G720 Drug-induced myopathy: Secondary | ICD-10-CM | POA: Diagnosis not present

## 2022-08-05 DIAGNOSIS — Z125 Encounter for screening for malignant neoplasm of prostate: Secondary | ICD-10-CM | POA: Diagnosis not present

## 2022-08-05 DIAGNOSIS — E538 Deficiency of other specified B group vitamins: Secondary | ICD-10-CM | POA: Diagnosis not present

## 2022-08-05 NOTE — Telephone Encounter (Signed)
  Ellsworth Lennox, PA-C 08/01/2022  4:28 PM EDT     Covering for Dr. Diona Browner - Please let the patient know his echocardiogram shows normal pumping function of his heart with a preserved ejection fraction of 55 to 60%. He does have moderate thickness of the heart muscle with slightly abnormal relaxation and good blood pressure control is essential. Right ventricular function is normal. His aortic valve stenosis remains in a moderate range which is overall similar to prior imaging.  He does have follow-up with Dr. Diona Browner in a few weeks and would keep that visit for clinical reassessment.

## 2022-08-05 NOTE — Telephone Encounter (Signed)
Patient notified and verbalized understanding of results. Patient had no questions or concerns at this time regarding results. PCP copied.

## 2022-08-05 NOTE — Telephone Encounter (Signed)
Left a message for patient to call office back regarding testing results.  

## 2022-08-12 ENCOUNTER — Ambulatory Visit: Payer: PPO | Attending: Cardiology | Admitting: Cardiology

## 2022-08-12 ENCOUNTER — Encounter: Payer: Self-pay | Admitting: Cardiology

## 2022-08-12 VITALS — BP 140/70 | HR 73 | Wt 187.0 lb

## 2022-08-12 DIAGNOSIS — I25119 Atherosclerotic heart disease of native coronary artery with unspecified angina pectoris: Secondary | ICD-10-CM

## 2022-08-12 DIAGNOSIS — I350 Nonrheumatic aortic (valve) stenosis: Secondary | ICD-10-CM | POA: Diagnosis not present

## 2022-08-12 DIAGNOSIS — T466X5A Adverse effect of antihyperlipidemic and antiarteriosclerotic drugs, initial encounter: Secondary | ICD-10-CM

## 2022-08-12 DIAGNOSIS — I10 Essential (primary) hypertension: Secondary | ICD-10-CM

## 2022-08-12 DIAGNOSIS — T466X5D Adverse effect of antihyperlipidemic and antiarteriosclerotic drugs, subsequent encounter: Secondary | ICD-10-CM | POA: Diagnosis not present

## 2022-08-12 DIAGNOSIS — M791 Myalgia, unspecified site: Secondary | ICD-10-CM

## 2022-08-12 DIAGNOSIS — E782 Mixed hyperlipidemia: Secondary | ICD-10-CM

## 2022-08-12 NOTE — Patient Instructions (Signed)

## 2022-08-12 NOTE — Progress Notes (Signed)
Cardiology Office Note  Date: 08/12/2022   ID: Colin Weber, DOB 1945-08-16, MRN [id number]  History of Present Illness: Colin Weber is a 77 y.o. male last seen in November 2023.  He is here for a routine visit.  Reports no progressive chest pain with activity, resolution of nocturnal chest discomfort as well.  Stable NYHA class II dyspnea, no palpitations or syncope.  I reviewed his medications, he reports compliance with therapy.  Does have statin intolerance, has been on Zetia with recent LDL 122.  He has declined other treatment options as discussed below.  I did offer to him referral to lipid clinic and he prefers to hold off for now and focus more on his diet.  ECG today shows sinus rhythm with left anterior fascicular block which is old.  He did have a recent echocardiogram which we also discussed.  Physical Exam: VS:  BP (!) 140/70   Pulse 73   Wt 187 lb (84.8 kg)   SpO2 95%   BMI 29.29 kg/m , BMI Body mass index is 29.29 kg/m.  Wt Readings from Last 3 Encounters:  08/12/22 187 lb (84.8 kg)  01/30/22 189 lb (85.7 kg)  07/17/21 196 lb 3.2 oz (89 kg)    General: Patient appears comfortable at rest. HEENT: Conjunctiva and lids normal. Neck: Supple, no elevated JVP or carotid bruits. Lungs: Clear to auscultation, nonlabored breathing at rest. Cardiac: Regular rate and rhythm, no S3, 3/6 systolic murmur. Extremities: No pitting edema.  ECG:  An ECG dated 07/17/2021 was personally reviewed today and demonstrated:  Sinus rhythm with left anterior fascicular block.  Labwork:  May 2024: Hemoglobin 14.5, platelets 275, BUN 18, creatinine 1.21, potassium 4.5, AST 26, ALT 17, cholesterol 188, triglycerides 156, HDL 38, LDL 122, hemoglobin A1c 5.6%  Other Studies Reviewed Today:  Echocardiogram 08/01/2022:  1. Left ventricular ejection fraction, by estimation, is 55 to 60%. The  left ventricle has normal function. The left ventricle has no regional  wall motion  abnormalities. There is moderate left ventricular hypertrophy.  Left ventricular diastolic  parameters are consistent with Grade I diastolic dysfunction (impaired  relaxation). The average left ventricular global longitudinal strain is  -21.9 %. The global longitudinal strain is normal.   2. Right ventricular systolic function is normal. The right ventricular  size is normal. Tricuspid regurgitation signal is inadequate for assessing  PA pressure.   3. The mitral valve is abnormal. No evidence of mitral valve  regurgitation. No evidence of mitral stenosis. Moderate mitral annular  calcification.  4. The aortic valve has an indeterminant number of cusps. Aortic valve  regurgitation is not visualized. Moderate aortic valve stenosis. Aortic  valve mean gradient measures 22.0 mmHg. Aortic valve peak gradient  measures 35.9 mmHg. Aortic valve area, by  VTI measures 1.26 cm.   5. The inferior vena cava is normal in size with greater than 50%  respiratory variability, suggesting right atrial pressure of 3 mmHg.   Assessment and Plan:  1.  Ischemic heart disease based on prior noninvasive cardiac imaging consistent with inferolateral infarct scar and mild peri-infarct ischemia.  He has been managed medically.  He reports no progressive angina and ECG is reviewed and stable.  Continue aspirin, atenolol, Imdur, Zetia, and as needed nitroglycerin.  2.  Degenerative calcific aortic stenosis.  Echocardiogram in May of this year revealed LVEF 55 to 60% with mean AV gradient 22 mmHg and dimensionless index 0.44 consistent with moderate aortic stenosis.  Plan follow-up  echocardiogram in 1 year.  3.  Essential hypertension.  No changes made to current regimen.  Keep follow-up with Dr. Margo Aye.  4.  Mixed hyperlipidemia with statin myalgias.  He has declined Nexlizet and PCSK9 inhibitors.  Continue Zetia.  He states that he will continue to work on his diet.  I did offer lipid clinic consultation as  well.  Disposition:  Follow up  6 months.  Signed, Jonelle Sidle, M.D., F.A.C.C. [facility] at [facility]

## 2022-08-21 DIAGNOSIS — E538 Deficiency of other specified B group vitamins: Secondary | ICD-10-CM | POA: Diagnosis not present

## 2022-09-16 ENCOUNTER — Other Ambulatory Visit: Payer: Self-pay | Admitting: Cardiology

## 2022-09-22 DIAGNOSIS — E538 Deficiency of other specified B group vitamins: Secondary | ICD-10-CM | POA: Diagnosis not present

## 2022-10-20 DIAGNOSIS — E538 Deficiency of other specified B group vitamins: Secondary | ICD-10-CM | POA: Diagnosis not present

## 2022-11-20 DIAGNOSIS — E538 Deficiency of other specified B group vitamins: Secondary | ICD-10-CM | POA: Diagnosis not present

## 2022-12-02 DIAGNOSIS — R7301 Impaired fasting glucose: Secondary | ICD-10-CM | POA: Diagnosis not present

## 2022-12-02 DIAGNOSIS — E538 Deficiency of other specified B group vitamins: Secondary | ICD-10-CM | POA: Diagnosis not present

## 2022-12-02 DIAGNOSIS — I10 Essential (primary) hypertension: Secondary | ICD-10-CM | POA: Diagnosis not present

## 2022-12-02 DIAGNOSIS — Z125 Encounter for screening for malignant neoplasm of prostate: Secondary | ICD-10-CM | POA: Diagnosis not present

## 2022-12-09 DIAGNOSIS — Z125 Encounter for screening for malignant neoplasm of prostate: Secondary | ICD-10-CM | POA: Diagnosis not present

## 2022-12-09 DIAGNOSIS — E538 Deficiency of other specified B group vitamins: Secondary | ICD-10-CM | POA: Diagnosis not present

## 2022-12-09 DIAGNOSIS — E782 Mixed hyperlipidemia: Secondary | ICD-10-CM | POA: Diagnosis not present

## 2022-12-09 DIAGNOSIS — I2510 Atherosclerotic heart disease of native coronary artery without angina pectoris: Secondary | ICD-10-CM | POA: Diagnosis not present

## 2022-12-09 DIAGNOSIS — J449 Chronic obstructive pulmonary disease, unspecified: Secondary | ICD-10-CM | POA: Diagnosis not present

## 2022-12-09 DIAGNOSIS — I2089 Other forms of angina pectoris: Secondary | ICD-10-CM | POA: Diagnosis not present

## 2022-12-09 DIAGNOSIS — G720 Drug-induced myopathy: Secondary | ICD-10-CM | POA: Diagnosis not present

## 2022-12-09 DIAGNOSIS — R7301 Impaired fasting glucose: Secondary | ICD-10-CM | POA: Diagnosis not present

## 2022-12-09 DIAGNOSIS — Z6828 Body mass index (BMI) 28.0-28.9, adult: Secondary | ICD-10-CM | POA: Diagnosis not present

## 2022-12-09 DIAGNOSIS — I350 Nonrheumatic aortic (valve) stenosis: Secondary | ICD-10-CM | POA: Diagnosis not present

## 2022-12-09 DIAGNOSIS — I10 Essential (primary) hypertension: Secondary | ICD-10-CM | POA: Diagnosis not present

## 2022-12-19 DIAGNOSIS — E538 Deficiency of other specified B group vitamins: Secondary | ICD-10-CM | POA: Diagnosis not present

## 2023-01-21 DIAGNOSIS — E538 Deficiency of other specified B group vitamins: Secondary | ICD-10-CM | POA: Diagnosis not present

## 2023-02-11 ENCOUNTER — Ambulatory Visit: Payer: PPO | Attending: Cardiology | Admitting: Cardiology

## 2023-02-11 ENCOUNTER — Encounter: Payer: Self-pay | Admitting: Cardiology

## 2023-02-11 VITALS — BP 116/70 | HR 73 | Ht 68.0 in | Wt 182.0 lb

## 2023-02-11 DIAGNOSIS — I25119 Atherosclerotic heart disease of native coronary artery with unspecified angina pectoris: Secondary | ICD-10-CM

## 2023-02-11 DIAGNOSIS — I10 Essential (primary) hypertension: Secondary | ICD-10-CM

## 2023-02-11 DIAGNOSIS — I350 Nonrheumatic aortic (valve) stenosis: Secondary | ICD-10-CM | POA: Diagnosis not present

## 2023-02-11 DIAGNOSIS — E782 Mixed hyperlipidemia: Secondary | ICD-10-CM | POA: Diagnosis not present

## 2023-02-11 NOTE — Patient Instructions (Addendum)
Medication Instructions:  Your physician recommends that you continue on your current medications as directed. Please refer to the Current Medication list given to you today.  *If you need a refill on your cardiac medications before your next appointment, please call your pharmacy*   Lab Work: None If you have labs (blood work) drawn today and your tests are completely normal, you will receive your results only by: MyChart Message (if you have MyChart) OR A paper copy in the mail If you have any lab test that is abnormal or we need to change your treatment, we will call you to review the results.   Testing/Procedures: None   Follow-Up: At [facility], you and your health needs are our priority.  As part of our continuing mission to provide you with exceptional heart care, we have created designated Provider Care Teams.  These Care Teams include your primary Cardiologist (physician) and Advanced Practice Providers (APPs -  Physician Assistants and Nurse Practitioners) who all work together to provide you with the care you need, when you need it.  We recommend signing up for the patient portal called "MyChart".  Sign up information is provided on this After Visit Summary.  MyChart is used to connect with patients for Virtual Visits (Telemedicine).  Patients are able to view lab/test results, encounter notes, upcoming appointments, etc.  Non-urgent messages can be sent to your provider as well.   To learn more about what you can do with MyChart, go to [email or web address].    Your next appointment:   6 month(s)  Provider:   You may see Nona Dell, MD or one of the following Advanced Practice Providers on your designated Care Team:   Randall An, PA-C  Jacolyn Reedy, [place]     Other Instructions

## 2023-02-11 NOTE — Progress Notes (Signed)
Cardiology Office Note  Date: 02/11/2023   ID: Colin Weber, DOB 1945/08/20, MRN [id number]  History of Present Illness: Colin Weber is a 77 y.o. male last seen in June.  He is here for a follow-up visit.  Reports no angina and stable NYHA class II dyspnea with typical activities.  No palpitations or syncope.  I reviewed his medications.  Current cardiac regimen includes aspirin, atenolol, Imdur, Zetia, omega-3 supplements, fenofibrate, and as needed nitroglycerin.  He continues to follow with Dr. Margo Aye.  I reviewed his lab work from October.  I reviewed his echocardiogram from May, plan will be a repeat study around the time of his next visit.  Physical Exam: VS:  BP 116/70   Pulse 73   Ht 5\' 8"  (1.727 m)   Wt 182 lb (82.6 kg)   SpO2 94%   BMI 27.67 kg/m , BMI Body mass index is 27.67 kg/m.  Wt Readings from Last 3 Encounters:  02/11/23 182 lb (82.6 kg)  08/12/22 187 lb (84.8 kg)  01/30/22 189 lb (85.7 kg)    General: Patient appears comfortable at rest. HEENT: Conjunctiva and lids normal. Neck: Supple, no elevated JVP or carotid bruits. Lungs: Clear to auscultation, nonlabored breathing at rest. Cardiac: Regular rate and rhythm, no S3, 3/6 systolic murmur. Extremities: No pitting edema.  ECG:  An ECG dated 08/12/2022 was personally reviewed today and demonstrated:  Sinus rhythm with left anterior fascicular block.  Labwork:  October 2024: Hemoglobin 14.9, platelets 288, BUN 16, creatinine 1.22, potassium 4.5, AST 25, ALT 14, cholesterol 179, triglycerides 164, HDL 36, LDL 114, hemoglobin A1c 5.6%  Other Studies Reviewed Today:  Echocardiogram 08/01/2022:  1. Left ventricular ejection fraction, by estimation, is 55 to 60%. The  left ventricle has normal function. The left ventricle has no regional  wall motion abnormalities. There is moderate left ventricular hypertrophy.  Left ventricular diastolic  parameters are consistent with Grade I diastolic dysfunction  (impaired  relaxation). The average left ventricular global longitudinal strain is  -21.9 %. The global longitudinal strain is normal.   2. Right ventricular systolic function is normal. The right ventricular  size is normal. Tricuspid regurgitation signal is inadequate for assessing  PA pressure.   3. The mitral valve is abnormal. No evidence of mitral valve  regurgitation. No evidence of mitral stenosis. Moderate mitral annular  calcification.  4. The aortic valve has an indeterminant number of cusps. Aortic valve  regurgitation is not visualized. Moderate aortic valve stenosis. Aortic  valve mean gradient measures 22.0 mmHg. Aortic valve peak gradient  measures 35.9 mmHg. Aortic valve area, by  VTI measures 1.26 cm.   5. The inferior vena cava is normal in size with greater than 50%  respiratory variability, suggesting right atrial pressure of 3 mmHg.  Assessment and Plan:  1.  Ischemic heart disease based on prior noninvasive cardiac imaging consistent with inferolateral infarct scar and mild peri-infarct ischemia.  He has been managed medically and does not report any significant angina or increasing functional limitation.  Continue aspirin, atenolol, Imdur, and Zetia. He has as needed nitroglycerin available.   2.  Degenerative calcific aortic stenosis.  Echocardiogram in May revealed LVEF 55 to 60% with mean AV gradient 22 mmHg and dimensionless index 0.44 consistent with moderate aortic stenosis.  Plan repeat study around the time of his next visit in 6 months.   3.  Primary hypertension.  Blood pressure is well-controlled today.  No changes made to current regimen.  4.  Mixed hyperlipidemia with statin myalgias.  He has declined Nexlizet and PCSK9 inhibitors.  Continue Zetia, omega-3 supplements, and fenofibrate.  LDL 114 and triglycerides 782 in October.  Disposition:  Follow up  6 months.  Signed, Jonelle Sidle, M.D., F.A.C.C. [facility] at [facility]

## 2023-02-18 DIAGNOSIS — E538 Deficiency of other specified B group vitamins: Secondary | ICD-10-CM | POA: Diagnosis not present

## 2023-03-20 DIAGNOSIS — E538 Deficiency of other specified B group vitamins: Secondary | ICD-10-CM | POA: Diagnosis not present

## 2023-05-19 DIAGNOSIS — E538 Deficiency of other specified B group vitamins: Secondary | ICD-10-CM | POA: Diagnosis not present

## 2023-06-11 DIAGNOSIS — R7301 Impaired fasting glucose: Secondary | ICD-10-CM | POA: Diagnosis not present

## 2023-06-11 DIAGNOSIS — E538 Deficiency of other specified B group vitamins: Secondary | ICD-10-CM | POA: Diagnosis not present

## 2023-06-11 DIAGNOSIS — I10 Essential (primary) hypertension: Secondary | ICD-10-CM | POA: Diagnosis not present

## 2023-06-16 DIAGNOSIS — Z0001 Encounter for general adult medical examination with abnormal findings: Secondary | ICD-10-CM | POA: Diagnosis not present

## 2023-06-16 DIAGNOSIS — I2089 Other forms of angina pectoris: Secondary | ICD-10-CM | POA: Diagnosis not present

## 2023-06-16 DIAGNOSIS — G720 Drug-induced myopathy: Secondary | ICD-10-CM | POA: Diagnosis not present

## 2023-06-16 DIAGNOSIS — E663 Overweight: Secondary | ICD-10-CM | POA: Diagnosis not present

## 2023-06-16 DIAGNOSIS — R7301 Impaired fasting glucose: Secondary | ICD-10-CM | POA: Diagnosis not present

## 2023-06-16 DIAGNOSIS — E538 Deficiency of other specified B group vitamins: Secondary | ICD-10-CM | POA: Diagnosis not present

## 2023-06-16 DIAGNOSIS — J449 Chronic obstructive pulmonary disease, unspecified: Secondary | ICD-10-CM | POA: Diagnosis not present

## 2023-06-16 DIAGNOSIS — I350 Nonrheumatic aortic (valve) stenosis: Secondary | ICD-10-CM | POA: Diagnosis not present

## 2023-06-16 DIAGNOSIS — I2510 Atherosclerotic heart disease of native coronary artery without angina pectoris: Secondary | ICD-10-CM | POA: Diagnosis not present

## 2023-06-16 DIAGNOSIS — Z6827 Body mass index (BMI) 27.0-27.9, adult: Secondary | ICD-10-CM | POA: Diagnosis not present

## 2023-06-16 DIAGNOSIS — I10 Essential (primary) hypertension: Secondary | ICD-10-CM | POA: Diagnosis not present

## 2023-06-16 DIAGNOSIS — E782 Mixed hyperlipidemia: Secondary | ICD-10-CM | POA: Diagnosis not present

## 2023-06-18 DIAGNOSIS — E538 Deficiency of other specified B group vitamins: Secondary | ICD-10-CM | POA: Diagnosis not present

## 2023-07-20 DIAGNOSIS — E538 Deficiency of other specified B group vitamins: Secondary | ICD-10-CM | POA: Diagnosis not present

## 2023-08-05 ENCOUNTER — Ambulatory Visit: Payer: Self-pay | Admitting: Cardiology

## 2023-08-05 ENCOUNTER — Ambulatory Visit (HOSPITAL_COMMUNITY)
Admission: RE | Admit: 2023-08-05 | Discharge: 2023-08-05 | Disposition: A | Discharge status: Home / Self Care | Payer: PPO | Source: Ambulatory Visit | Attending: Internal Medicine | Admitting: Internal Medicine

## 2023-08-05 DIAGNOSIS — I350 Nonrheumatic aortic (valve) stenosis: Secondary | ICD-10-CM

## 2023-08-05 LAB — ECHOCARDIOGRAM COMPLETE
AR max vel: 1 cm2
AV Area VTI: 1.02 cm2
AV Area mean vel: 1 cm2
AV Mean grad: 23.4 mmHg
AV Peak grad: 39.2 mmHg
Ao pk vel: 3.13 m/s
Area-P 1/2: 2.87 cm2
S' Lateral: 2.5 cm

## 2023-08-05 MED ORDER — PERFLUTREN LIPID MICROSPHERE
1.0000 mL | INTRAVENOUS | Status: AC | PRN
  Administered 2023-08-05: 3 mL via INTRAVENOUS

## 2023-08-05 NOTE — Progress Notes (Signed)
*  PRELIMINARY RESULTS* Echocardiogram 2D Echocardiogram has been performed with [facility] .  Colin Weber 08/05/2023, 2:04 PM

## 2023-08-07 NOTE — Telephone Encounter (Signed)
 Pt returning call

## 2023-08-18 ENCOUNTER — Encounter: Payer: Self-pay | Admitting: Cardiology

## 2023-08-18 ENCOUNTER — Ambulatory Visit: Payer: PPO | Attending: Cardiology | Admitting: Cardiology

## 2023-08-18 VITALS — BP 138/82 | HR 68 | Ht 67.0 in | Wt 174.2 lb

## 2023-08-18 DIAGNOSIS — E782 Mixed hyperlipidemia: Secondary | ICD-10-CM

## 2023-08-18 DIAGNOSIS — M791 Myalgia, unspecified site: Secondary | ICD-10-CM | POA: Diagnosis not present

## 2023-08-18 DIAGNOSIS — I350 Nonrheumatic aortic (valve) stenosis: Secondary | ICD-10-CM | POA: Diagnosis not present

## 2023-08-18 DIAGNOSIS — I10 Essential (primary) hypertension: Secondary | ICD-10-CM

## 2023-08-18 DIAGNOSIS — I25119 Atherosclerotic heart disease of native coronary artery with unspecified angina pectoris: Secondary | ICD-10-CM | POA: Diagnosis not present

## 2023-08-18 DIAGNOSIS — T466X5D Adverse effect of antihyperlipidemic and antiarteriosclerotic drugs, subsequent encounter: Secondary | ICD-10-CM

## 2023-08-18 NOTE — Patient Instructions (Signed)
Medication Instructions:  °Your physician recommends that you continue on your current medications as directed. Please refer to the Current Medication list given to you today. ° ° °Labwork: °none ° °Testing/Procedures: °none ° °Follow-Up: °6 months ° °Any Other Special Instructions Will Be Listed Below (If Applicable). ° °If you need a refill on your cardiac medications before your next appointment, please call your pharmacy. ° °

## 2023-08-18 NOTE — Progress Notes (Signed)
 Cardiology Office Note  Date: 08/18/2023   ID: TAUHEED MCFAYDEN, DOB 02/25/1946, MRN [id number]  History of Present Illness: Colin Weber is a 78 y.o. male last seen in December 2024.  He is here for a routine visit.  Reports no angina or nitroglycerin use, stable NYHA class II dyspnea with typical activities.  No palpitations or syncope.  We went over his medications.  He reports compliance and no specific concerns.  I reviewed his interval lab work from Dr. Quentin Brunner office.  We also went over the results of his recent echocardiogram showing stable, moderate range aortic stenosis.  I reviewed his ECG today which shows sinus rhythm with left anterior fascicular block and LVH.  Physical Exam: VS:  BP 138/82   Pulse 68   Ht 5' 7 (1.702 m)   Wt 174 lb 3.2 oz (79 kg)   SpO2 95%   BMI 27.28 kg/m , BMI Body mass index is 27.28 kg/m.  Wt Readings from Last 3 Encounters:  08/18/23 174 lb 3.2 oz (79 kg)  02/11/23 182 lb (82.6 kg)  08/12/22 187 lb (84.8 kg)    General: Patient appears comfortable at rest. HEENT: Conjunctiva and lids normal. Neck: Supple, no elevated JVP or carotid bruits. Lungs: Clear to auscultation, nonlabored breathing at rest. Cardiac: Regular rate and rhythm, no S3, 3/6 systolic murmur. Extremities: No pitting edema.  ECG:  An ECG dated 08/12/2022 was personally reviewed today and demonstrated:  Sinus rhythm with left anterior fascicular block.  Labwork:  April 2025: Hemoglobin 14.7, platelets 290, BUN 17, creatinine 1.1, GFR 70, potassium 4.6, AST 22, ALT 16, cholesterol 170, triglycerides 118, HDL 40, LDL 109, hemoglobin A1c 5.5%  Other Studies Reviewed Today:  Echocardiogram 08/05/2023:  1. Left ventricular ejection fraction, by estimation, is 60 to 65%. The  left ventricle has normal function. The left ventricle has no regional  wall motion abnormalities. There is moderate left ventricular hypertrophy.  Left ventricular diastolic  parameters are  consistent with Grade I diastolic dysfunction (impaired  relaxation).   2. Right ventricular systolic function is normal. The right ventricular  size is normal.   3. Left atrial size was moderately dilated.   4. The mitral valve is normal in structure. No evidence of mitral valve  regurgitation. No evidence of mitral stenosis.   5. The aortic valve was not well visualized. Aortic valve regurgitation  is mild. Moderate aortic valve stenosis. Aortic valve area, by VTI  measures 1.02 cm. Aortic valve mean gradient measures 23.4 mmHg.   6. The inferior vena cava is normal in size with greater than 50%  respiratory variability, suggesting right atrial pressure of 3 mmHg.   Assessment and Plan:  1.  Ischemic heart disease based on prior noninvasive cardiac imaging consistent with inferolateral infarct scar and mild peri-infarct ischemia.  He remains clinically stable without angina and NYHA class II dyspnea.  ECG reviewed.  Continue aspirin  81 mg daily, Imdur  60 mg daily, and Zetia 10 mg daily.   2.  Degenerative calcific aortic stenosis.  Echocardiogram this June is consistent with moderate aortic stenosis, mean AV gradient 23 mmHg and dimensionless index 0.36.  Asymptomatic.  Plan follow-up echocardiogram in 1 year.   3.  Primary hypertension.  Continue atenolol 25 mg twice daily.   4.  Mixed hyperlipidemia with statin myalgias.  He has declined Nexlizet and PCSK9 inhibitors.  Continue Zetia, omega-3 supplements, and fenofibrate.  LDL 109 and HDL 40 in April.  Disposition:  Follow  up 6 months.  Signed, Gerard Knight, M.D., F.A.C.C. [facility] at [facility]

## 2023-08-19 DIAGNOSIS — E538 Deficiency of other specified B group vitamins: Secondary | ICD-10-CM | POA: Diagnosis not present

## 2023-08-28 ENCOUNTER — Other Ambulatory Visit: Payer: Self-pay | Admitting: Cardiology

## 2023-09-18 DIAGNOSIS — E538 Deficiency of other specified B group vitamins: Secondary | ICD-10-CM | POA: Diagnosis not present

## 2023-10-21 DIAGNOSIS — E538 Deficiency of other specified B group vitamins: Secondary | ICD-10-CM | POA: Diagnosis not present

## 2023-11-20 DIAGNOSIS — E538 Deficiency of other specified B group vitamins: Secondary | ICD-10-CM | POA: Diagnosis not present

## 2023-12-10 DIAGNOSIS — R7301 Impaired fasting glucose: Secondary | ICD-10-CM | POA: Diagnosis not present

## 2023-12-10 DIAGNOSIS — I10 Essential (primary) hypertension: Secondary | ICD-10-CM | POA: Diagnosis not present

## 2023-12-10 DIAGNOSIS — E538 Deficiency of other specified B group vitamins: Secondary | ICD-10-CM | POA: Diagnosis not present

## 2023-12-16 DIAGNOSIS — E538 Deficiency of other specified B group vitamins: Secondary | ICD-10-CM | POA: Diagnosis not present

## 2023-12-16 DIAGNOSIS — I2510 Atherosclerotic heart disease of native coronary artery without angina pectoris: Secondary | ICD-10-CM | POA: Diagnosis not present

## 2023-12-16 DIAGNOSIS — R7301 Impaired fasting glucose: Secondary | ICD-10-CM | POA: Diagnosis not present

## 2023-12-16 DIAGNOSIS — E782 Mixed hyperlipidemia: Secondary | ICD-10-CM | POA: Diagnosis not present

## 2023-12-16 DIAGNOSIS — I25118 Atherosclerotic heart disease of native coronary artery with other forms of angina pectoris: Secondary | ICD-10-CM | POA: Diagnosis not present

## 2023-12-16 DIAGNOSIS — Z6828 Body mass index (BMI) 28.0-28.9, adult: Secondary | ICD-10-CM | POA: Diagnosis not present

## 2023-12-16 DIAGNOSIS — G720 Drug-induced myopathy: Secondary | ICD-10-CM | POA: Diagnosis not present

## 2023-12-16 DIAGNOSIS — J449 Chronic obstructive pulmonary disease, unspecified: Secondary | ICD-10-CM | POA: Diagnosis not present

## 2023-12-16 DIAGNOSIS — E663 Overweight: Secondary | ICD-10-CM | POA: Diagnosis not present

## 2023-12-16 DIAGNOSIS — I350 Nonrheumatic aortic (valve) stenosis: Secondary | ICD-10-CM | POA: Diagnosis not present

## 2023-12-16 DIAGNOSIS — I10 Essential (primary) hypertension: Secondary | ICD-10-CM | POA: Diagnosis not present

## 2023-12-16 DIAGNOSIS — Z23 Encounter for immunization: Secondary | ICD-10-CM | POA: Diagnosis not present

## 2023-12-21 DIAGNOSIS — E538 Deficiency of other specified B group vitamins: Secondary | ICD-10-CM | POA: Diagnosis not present

## 2024-01-21 DIAGNOSIS — E538 Deficiency of other specified B group vitamins: Secondary | ICD-10-CM | POA: Diagnosis not present

## 2024-03-18 ENCOUNTER — Encounter: Payer: Self-pay | Admitting: Cardiology

## 2024-03-18 ENCOUNTER — Ambulatory Visit: Attending: Cardiology | Admitting: Cardiology

## 2024-03-18 VITALS — BP 128/70 | HR 69 | Ht 67.5 in | Wt 171.6 lb

## 2024-03-18 DIAGNOSIS — E782 Mixed hyperlipidemia: Secondary | ICD-10-CM

## 2024-03-18 DIAGNOSIS — I350 Nonrheumatic aortic (valve) stenosis: Secondary | ICD-10-CM | POA: Diagnosis not present

## 2024-03-18 DIAGNOSIS — I25119 Atherosclerotic heart disease of native coronary artery with unspecified angina pectoris: Secondary | ICD-10-CM

## 2024-03-18 DIAGNOSIS — I10 Essential (primary) hypertension: Secondary | ICD-10-CM | POA: Diagnosis not present

## 2024-03-18 NOTE — Patient Instructions (Signed)
 Medication Instructions:   Your physician recommends that you continue on your current medications as directed. Please refer to the Current Medication list given to you today.   Labwork: None today  Testing/Procedures: Echo in June  Follow-Up: July with Dr.McDowell  Any Other Special Instructions Will Be Listed Below (If Applicable).  If you need a refill on your cardiac medications before your next appointment, please call your pharmacy.

## 2024-03-18 NOTE — Progress Notes (Signed)
"  ° ° °  Cardiology Office Note  Date: 03/18/2024   ID: CESARE SUMLIN, DOB 16-Jan-1946, MRN [id number]  History of Present Illness: Colin Weber is a 79 y.o. male last seen in June 2025.  He is here for a routine visit.  Reports no recurrent exertional angina or interval nitroglycerin use.  No specific change in stamina.  No palpitations or syncope.  We went over his medications.  He reports compliance with current therapy, no obvious intolerances.  I reviewed his interval lab work from October 2025.  I rechecked his blood pressure today at 128/70.  He continues to follow through the [facility] system as well.  Physical Exam: VS:  BP 128/70 (BP Location: Left Arm)   Pulse 69   Ht 5' 7.5 (1.715 m)   Wt 171 lb 9.6 oz (77.8 kg)   SpO2 95%   BMI 26.48 kg/m , BMI Body mass index is 26.48 kg/m.  Wt Readings from Last 3 Encounters:  03/18/24 171 lb 9.6 oz (77.8 kg)  08/18/23 174 lb 3.2 oz (79 kg)  02/11/23 182 lb (82.6 kg)    General: Patient appears comfortable at rest. HEENT: Conjunctiva and lids normal. Neck: Supple, no elevated JVP or carotid bruits. Lungs: Clear to auscultation, nonlabored breathing at rest. Cardiac: Regular rate and rhythm, no S3, 3/6 systolic murmur. Extremities: No pitting edema.  ECG:  An ECG dated 08/18/2023 was personally reviewed today and demonstrated:  Sinus rhythm with left anterior fascicular block and LVH.  Labwork:  October 2025: Hemoglobin 15, platelets 277, BUN 23, creatinine 1.19, GFR 63, potassium 4.7, AST 23, ALT 16, cholesterol 193, triglycerides 121, HDL 39, LDL 132, hemoglobin A1c 5.3%  Other Studies Reviewed Today:  No interval cardiac testing for review today.  Assessment and Plan:  1.  Ischemic heart disease based on prior noninvasive cardiac imaging consistent with inferolateral infarct scar and mild peri-infarct ischemia.  He reports no angina on medical therapy, plan to continue observation for now.  Currently on aspirin  81 mg  daily, Imdur  60 mg daily, and Zetia 10 mg daily.   2.  Degenerative calcific aortic stenosis.  Echocardiogram in June 2025 showed moderate aortic stenosis, mean AV gradient 23 mmHg and dimensionless index 0.36.  Plan to update echocardiogram in June of this year.  Not clearly symptomatic.   3.  Primary hypertension.  Continue atenolol 25 mg twice daily.   4.  Mixed hyperlipidemia with statin myalgias.  He has declined Nexlizet and PCSK9 inhibitors.  Continue Zetia, omega-3 supplements, and fenofibrate.  LDL 132 and HDL 39 in October 2025.  Disposition:  Follow up 6 months.  Signed, Jayson JUDITHANN Sierras, M.D., F.A.C.C. [facility] "

## 2024-08-01 ENCOUNTER — Other Ambulatory Visit (HOSPITAL_COMMUNITY)
